# Patient Record
Sex: Male | Born: 1935 | Race: White | Hispanic: No | Smoking: Former smoker
Health system: Southern US, Community
[De-identification: ages and names within clinical notes are randomized; demographics above are authoritative.]

## PROBLEM LIST (undated history)

## (undated) DIAGNOSIS — K219 Gastro-esophageal reflux disease without esophagitis: Secondary | ICD-10-CM

## (undated) DIAGNOSIS — F329 Major depressive disorder, single episode, unspecified: Secondary | ICD-10-CM

## (undated) DIAGNOSIS — I639 Cerebral infarction, unspecified: Secondary | ICD-10-CM

## (undated) DIAGNOSIS — E7800 Pure hypercholesterolemia, unspecified: Secondary | ICD-10-CM

## (undated) DIAGNOSIS — F32A Depression, unspecified: Secondary | ICD-10-CM

## (undated) DIAGNOSIS — I219 Acute myocardial infarction, unspecified: Secondary | ICD-10-CM

## (undated) DIAGNOSIS — E119 Type 2 diabetes mellitus without complications: Secondary | ICD-10-CM

## (undated) DIAGNOSIS — I10 Essential (primary) hypertension: Secondary | ICD-10-CM

## (undated) HISTORY — PX: AORTIC VALVE REPLACEMENT (AVR)/CORONARY ARTERY BYPASS GRAFTING (CABG): SHX5725

---

## 2009-06-29 ENCOUNTER — Inpatient Hospital Stay: Payer: Self-pay | Admitting: Internal Medicine

## 2017-12-03 ENCOUNTER — Other Ambulatory Visit: Payer: Self-pay

## 2017-12-03 ENCOUNTER — Encounter: Payer: Self-pay | Admitting: Emergency Medicine

## 2017-12-03 ENCOUNTER — Inpatient Hospital Stay
Admission: EM | Admit: 2017-12-03 | Discharge: 2017-12-06 | DRG: 065 | Disposition: A | Discharge status: Skilled Nursing Facility | Payer: Medicare Other | Attending: Internal Medicine | Admitting: Internal Medicine

## 2017-12-03 ENCOUNTER — Emergency Department: Payer: Medicare Other

## 2017-12-03 DIAGNOSIS — R29898 Other symptoms and signs involving the musculoskeletal system: Secondary | ICD-10-CM

## 2017-12-03 DIAGNOSIS — Z8673 Personal history of transient ischemic attack (TIA), and cerebral infarction without residual deficits: Secondary | ICD-10-CM

## 2017-12-03 DIAGNOSIS — K219 Gastro-esophageal reflux disease without esophagitis: Secondary | ICD-10-CM | POA: Diagnosis present

## 2017-12-03 DIAGNOSIS — I2510 Atherosclerotic heart disease of native coronary artery without angina pectoris: Secondary | ICD-10-CM | POA: Diagnosis present

## 2017-12-03 DIAGNOSIS — Z952 Presence of prosthetic heart valve: Secondary | ICD-10-CM | POA: Diagnosis not present

## 2017-12-03 DIAGNOSIS — Z7902 Long term (current) use of antithrombotics/antiplatelets: Secondary | ICD-10-CM

## 2017-12-03 DIAGNOSIS — E785 Hyperlipidemia, unspecified: Secondary | ICD-10-CM | POA: Diagnosis present

## 2017-12-03 DIAGNOSIS — Z7982 Long term (current) use of aspirin: Secondary | ICD-10-CM

## 2017-12-03 DIAGNOSIS — I639 Cerebral infarction, unspecified: Secondary | ICD-10-CM | POA: Diagnosis not present

## 2017-12-03 DIAGNOSIS — F329 Major depressive disorder, single episode, unspecified: Secondary | ICD-10-CM | POA: Diagnosis present

## 2017-12-03 DIAGNOSIS — Z885 Allergy status to narcotic agent status: Secondary | ICD-10-CM | POA: Diagnosis not present

## 2017-12-03 DIAGNOSIS — G8194 Hemiplegia, unspecified affecting left nondominant side: Secondary | ICD-10-CM | POA: Diagnosis present

## 2017-12-03 DIAGNOSIS — I252 Old myocardial infarction: Secondary | ICD-10-CM | POA: Diagnosis not present

## 2017-12-03 DIAGNOSIS — E7800 Pure hypercholesterolemia, unspecified: Secondary | ICD-10-CM | POA: Diagnosis present

## 2017-12-03 DIAGNOSIS — Z794 Long term (current) use of insulin: Secondary | ICD-10-CM

## 2017-12-03 DIAGNOSIS — I10 Essential (primary) hypertension: Secondary | ICD-10-CM | POA: Diagnosis present

## 2017-12-03 DIAGNOSIS — F17220 Nicotine dependence, chewing tobacco, uncomplicated: Secondary | ICD-10-CM | POA: Diagnosis present

## 2017-12-03 DIAGNOSIS — E1151 Type 2 diabetes mellitus with diabetic peripheral angiopathy without gangrene: Secondary | ICD-10-CM | POA: Diagnosis present

## 2017-12-03 DIAGNOSIS — Z951 Presence of aortocoronary bypass graft: Secondary | ICD-10-CM | POA: Diagnosis not present

## 2017-12-03 DIAGNOSIS — R471 Dysarthria and anarthria: Secondary | ICD-10-CM | POA: Diagnosis present

## 2017-12-03 DIAGNOSIS — R29704 NIHSS score 4: Secondary | ICD-10-CM | POA: Diagnosis present

## 2017-12-03 HISTORY — DX: Pure hypercholesterolemia, unspecified: E78.00

## 2017-12-03 HISTORY — DX: Acute myocardial infarction, unspecified: I21.9

## 2017-12-03 HISTORY — DX: Type 2 diabetes mellitus without complications: E11.9

## 2017-12-03 HISTORY — DX: Essential (primary) hypertension: I10

## 2017-12-03 HISTORY — DX: Cerebral infarction, unspecified: I63.9

## 2017-12-03 HISTORY — DX: Depression, unspecified: F32.A

## 2017-12-03 HISTORY — DX: Major depressive disorder, single episode, unspecified: F32.9

## 2017-12-03 HISTORY — DX: Gastro-esophageal reflux disease without esophagitis: K21.9

## 2017-12-03 LAB — DIFFERENTIAL
BASOS PCT: 1 %
Basophils Absolute: 0 10*3/uL (ref 0–0.1)
Eosinophils Absolute: 0.3 10*3/uL (ref 0–0.7)
Eosinophils Relative: 5 %
LYMPHS ABS: 2.3 10*3/uL (ref 1.0–3.6)
Lymphocytes Relative: 29 %
MONO ABS: 0.8 10*3/uL (ref 0.2–1.0)
MONOS PCT: 10 %
NEUTROS ABS: 4.3 10*3/uL (ref 1.4–6.5)
Neutrophils Relative %: 55 %

## 2017-12-03 LAB — CBC
HEMATOCRIT: 41.1 % (ref 40.0–52.0)
HEMOGLOBIN: 13.6 g/dL (ref 13.0–18.0)
MCH: 30.2 pg (ref 26.0–34.0)
MCHC: 33.1 g/dL (ref 32.0–36.0)
MCV: 91.4 fL (ref 80.0–100.0)
Platelets: 215 10*3/uL (ref 150–440)
RBC: 4.49 MIL/uL (ref 4.40–5.90)
RDW: 14.2 % (ref 11.5–14.5)
WBC: 7.7 10*3/uL (ref 3.8–10.6)

## 2017-12-03 LAB — COMPREHENSIVE METABOLIC PANEL
ALK PHOS: 97 U/L (ref 38–126)
ALT: 10 U/L — AB (ref 17–63)
AST: 20 U/L (ref 15–41)
Albumin: 3.3 g/dL — ABNORMAL LOW (ref 3.5–5.0)
Anion gap: 4 — ABNORMAL LOW (ref 5–15)
BILIRUBIN TOTAL: 0.9 mg/dL (ref 0.3–1.2)
BUN: 18 mg/dL (ref 6–20)
CALCIUM: 9.3 mg/dL (ref 8.9–10.3)
CO2: 28 mmol/L (ref 22–32)
CREATININE: 0.79 mg/dL (ref 0.61–1.24)
Chloride: 103 mmol/L (ref 101–111)
Glucose, Bld: 351 mg/dL — ABNORMAL HIGH (ref 65–99)
Potassium: 4.2 mmol/L (ref 3.5–5.1)
Sodium: 135 mmol/L (ref 135–145)
TOTAL PROTEIN: 6.6 g/dL (ref 6.5–8.1)

## 2017-12-03 LAB — PROTIME-INR
INR: 0.98
Prothrombin Time: 12.9 seconds (ref 11.4–15.2)

## 2017-12-03 LAB — GLUCOSE, CAPILLARY
Glucose-Capillary: 342 mg/dL — ABNORMAL HIGH (ref 65–99)
Glucose-Capillary: 300 mg/dL — ABNORMAL HIGH (ref 65–99)

## 2017-12-03 LAB — TROPONIN I

## 2017-12-03 LAB — APTT: aPTT: 28 seconds (ref 24–36)

## 2017-12-03 MED ORDER — CLOPIDOGREL BISULFATE 75 MG PO TABS
75.0000 mg | ORAL_TABLET | Freq: Every evening | ORAL | Status: DC
  Administered 2017-12-03 – 2017-12-05 (×3): 75 mg via ORAL
  Filled 2017-12-03 (×3): qty 1

## 2017-12-03 MED ORDER — SENNOSIDES-DOCUSATE SODIUM 8.6-50 MG PO TABS: 1.0000 | ORAL_TABLET | Freq: Every evening | ORAL | Status: DC | PRN

## 2017-12-03 MED ORDER — INSULIN ASPART 100 UNIT/ML ~~LOC~~ SOLN
0.0000 [IU] | Freq: Every day | SUBCUTANEOUS | Status: DC
  Administered 2017-12-03: 22:00:00 3 [IU] via SUBCUTANEOUS
  Administered 2017-12-04: 23:00:00 2 [IU] via SUBCUTANEOUS
  Filled 2017-12-03 (×2): qty 1

## 2017-12-03 MED ORDER — ISOSORBIDE MONONITRATE ER 30 MG PO TB24
30.0000 mg | ORAL_TABLET | Freq: Every evening | ORAL | Status: DC
  Administered 2017-12-03 – 2017-12-05 (×3): 30 mg via ORAL
  Filled 2017-12-03 (×3): qty 1

## 2017-12-03 MED ORDER — INSULIN GLARGINE 100 UNIT/ML ~~LOC~~ SOLN
25.0000 [IU] | Freq: Every evening | SUBCUTANEOUS | Status: DC
  Administered 2017-12-03: 19:00:00 25 [IU] via SUBCUTANEOUS
  Filled 2017-12-03 (×2): qty 0.25

## 2017-12-03 MED ORDER — VITAMIN B-12 100 MCG PO TABS
100.0000 ug | ORAL_TABLET | Freq: Every day | ORAL | Status: DC
  Administered 2017-12-04 – 2017-12-06 (×3): 100 ug via ORAL
  Filled 2017-12-03 (×3): qty 1

## 2017-12-03 MED ORDER — ACETAMINOPHEN 160 MG/5ML PO SOLN: 650.0000 mg | ORAL | Status: DC | PRN

## 2017-12-03 MED ORDER — ENOXAPARIN SODIUM 40 MG/0.4ML ~~LOC~~ SOLN
40.0000 mg | SUBCUTANEOUS | Status: DC
  Administered 2017-12-03 – 2017-12-05 (×3): 40 mg via SUBCUTANEOUS
  Filled 2017-12-03 (×3): qty 0.4

## 2017-12-03 MED ORDER — ESCITALOPRAM OXALATE 10 MG PO TABS
10.0000 mg | ORAL_TABLET | Freq: Every day | ORAL | Status: DC
  Administered 2017-12-04 – 2017-12-06 (×3): 10 mg via ORAL
  Filled 2017-12-03 (×3): qty 1

## 2017-12-03 MED ORDER — PANTOPRAZOLE SODIUM 40 MG PO TBEC
40.0000 mg | DELAYED_RELEASE_TABLET | Freq: Two times a day (BID) | ORAL | Status: DC
  Administered 2017-12-03 – 2017-12-06 (×6): 40 mg via ORAL
  Filled 2017-12-03 (×6): qty 1

## 2017-12-03 MED ORDER — ACETAMINOPHEN 650 MG RE SUPP: 650.0000 mg | RECTAL | Status: DC | PRN

## 2017-12-03 MED ORDER — INSULIN ASPART 100 UNIT/ML ~~LOC~~ SOLN
0.0000 [IU] | Freq: Three times a day (TID) | SUBCUTANEOUS | Status: DC
  Administered 2017-12-03: 7 [IU] via SUBCUTANEOUS
  Administered 2017-12-04: 17:00:00 3 [IU] via SUBCUTANEOUS
  Administered 2017-12-04: 5 [IU] via SUBCUTANEOUS
  Administered 2017-12-05 (×2): 2 [IU] via SUBCUTANEOUS
  Administered 2017-12-05: 18:00:00 3 [IU] via SUBCUTANEOUS
  Administered 2017-12-06 (×2): 2 [IU] via SUBCUTANEOUS
  Filled 2017-12-03 (×8): qty 1

## 2017-12-03 MED ORDER — INSULIN GLARGINE 100 UNIT/ML ~~LOC~~ SOLN: 40.0000 [IU] | Freq: Every evening | SUBCUTANEOUS | Status: DC

## 2017-12-03 MED ORDER — ASPIRIN EC 81 MG PO TBEC
81.0000 mg | DELAYED_RELEASE_TABLET | Freq: Every day | ORAL | Status: DC
  Administered 2017-12-04 – 2017-12-06 (×3): 81 mg via ORAL
  Filled 2017-12-03 (×3): qty 1

## 2017-12-03 MED ORDER — STROKE: EARLY STAGES OF RECOVERY BOOK
Freq: Once | Status: AC
  Administered 2017-12-03: 17:00:00

## 2017-12-03 MED ORDER — ACETAMINOPHEN 325 MG PO TABS: 650.0000 mg | ORAL_TABLET | ORAL | Status: DC | PRN

## 2017-12-03 NOTE — ED Triage Notes (Signed)
Son reports pt has fallen 4 times over night.  Has noticed weakness in left leg over night.  Son reports was normal last night when he went bed.  Went to bed at 0930 pm 4/16.  Speech appears slurred in triage.  Son reports he is dragging leg now.  Son reports can lift arm but leg seems to keep getting weaker.

## 2017-12-03 NOTE — Code Documentation (Signed)
Pt arrives with complaints of left sided weakness, per son pt went to bed 4/16 2130 WNL, states he fell 4 times throughout the night and had increased left sided weakness this AM, code stroke activated in triage, Dr. Manson PasseyBrown cleared pt for CT, non-con head CT preformed and pt brought to room 10, NIHSS 4 with left sided weakness, dysarthria, and mild facial droop, no tPA due to LKW > 4.5 hrs, son at bedside, hearing aids given to son, CBG 313, report off to Dynegyngela RN

## 2017-12-03 NOTE — Progress Notes (Signed)
CODE STROKE- PHARMACY COMMUNICATION   Time CODE STROKE called/page received:1019  Time response to CODE STROKE was made (in person or via phone): 1100  Time Stroke Kit retrieved from [place] (only if needed): N/A   Past Medical History:  Diagnosis Date  . Acid reflux   . Depression   . Diabetes mellitus without complication ([place])   . Hypercholesteremia   . Hypertension   . MI (myocardial infarction) ([place])   . Stroke [facility])    Prior to Admission medications   Medication Sig Start Date End Date Taking? Authorizing Provider  aspirin EC 81 MG tablet Take 81 mg by mouth daily.   Yes [provider]  clopidogrel (PLAVIX) 75 MG tablet Take 75 mg by mouth every evening. 12/02/17  Yes [provider]  cyanocobalamin (TH VITAMIN B12) 100 MCG tablet Take 100 mcg by mouth daily.   Yes [provider]  escitalopram (LEXAPRO) 20 MG tablet Take 10 mg by mouth daily.   Yes [provider]  hydrochlorothiazide (MICROZIDE) 12.5 MG capsule Take 12.5 mg by mouth daily. 12/19/12  Yes [provider]  insulin glargine (LANTUS) 100 UNIT/ML injection Inject 54 Units into the skin every evening.   Yes [provider]  isosorbide mononitrate (IMDUR) 30 MG 24 hr tablet Take 30 mg by mouth every evening.   Yes [provider]  lisinopril (PRINIVIL,ZESTRIL) 2.5 MG tablet Take 2.5 mg by mouth daily.   Yes [provider]  pantoprazole (PROTONIX) 40 MG tablet Take 40 mg by mouth 2 (two) times daily.    Yes [provider]  propranolol (INDERAL) 10 MG tablet Take 10 mg by mouth 3 (three) times daily.   Yes [provider]  simvastatin (ZOCOR) 20 MG tablet Take 20 mg by mouth every evening.   Yes [provider]    Charlett Nose ,PharmD Clinical Pharmacist  12/03/2017  11:01 AM

## 2017-12-03 NOTE — ED Triage Notes (Signed)
Pt assess by dr brown

## 2017-12-03 NOTE — H&P (Signed)
[facility] [place] at [facility]   PATIENT NAME: Shannon Ayala    MR#:  [id number]  DATE OF BIRTH:  1924/03/16  DATE OF ADMISSION:  12/03/2017  PRIMARY CARE PHYSICIAN: System, Pcp Not In   REQUESTING/REFERRING PHYSICIAN: williams  CHIEF COMPLAINT:   Left leg weakness HISTORY OF PRESENT ILLNESS:  Shannon Ayala  is a 84 y.o. male with a known history of recurrent strokes, deafness metas, hypertension hyperlipidemia, history of MI, GERD is presenting to the ED with a chief complaint of worsening of left leg weakness.  CT head has revealed acute to subacute stroke.  Patient was evaluated by neurology Dr. Thad Ranger in the emergency department who has recommended to admit the patient for complete stroke workup.  Patient has passed bedside swallow evaluation.  Patient denies any headache or blurry vision.  Denies any chest pain or shortness of breath.  No family members at bedside.  PAST MEDICAL HISTORY:   Past Medical History:  Diagnosis Date  . Acid reflux   . Depression   . Diabetes mellitus without complication (HCC)   . Hypercholesteremia   . Hypertension   . MI (myocardial infarction) (HCC)   . Stroke [facility])     PAST SURGICAL HISTOIRY:   Past Surgical History:  Procedure Laterality Date  . AORTIC VALVE REPLACEMENT (AVR)/CORONARY ARTERY BYPASS GRAFTING (CABG)      SOCIAL HISTORY:   Social History   Tobacco Use  . Smoking status: Former Games developer  . Smokeless tobacco: Current User  Substance Use Topics  . Alcohol use: Yes    FAMILY HISTORY:  History reviewed. No pertinent family history.  DRUG ALLERGIES:   Allergies  Allergen Reactions  . Codeine     Reaction unknown  . Darvon [Propoxyphene]     Darvocet (Proproxyphene with Acetaminophen)  . Morphine     Reaction unknown    REVIEW OF SYSTEMS:  CONSTITUTIONAL: No fever, fatigue or weakness.  EYES: No blurred or double vision.  EARS, NOSE, AND THROAT: No tinnitus or ear  pain.  RESPIRATORY: No cough, shortness of breath, wheezing or hemoptysis.  CARDIOVASCULAR: No chest pain, orthopnea, edema.  GASTROINTESTINAL: No nausea, vomiting, diarrhea or abdominal pain.  GENITOURINARY: No dysuria, hematuria.  ENDOCRINE: No polyuria, nocturia,  HEMATOLOGY: No anemia, easy bruising or bleeding SKIN: No rash or lesion. MUSCULOSKELETAL: No joint pain or arthritis.   NEUROLOGIC: Reporting left-sided weakness she gets no tingling, numbness, weakness.  PSYCHIATRY: No anxiety or depression.   MEDICATIONS AT HOME:   Prior to Admission medications   Medication Sig Start Date End Date Taking? Authorizing Provider  aspirin EC 81 MG tablet Take 81 mg by mouth daily.   Yes [provider]  clopidogrel (PLAVIX) 75 MG tablet Take 75 mg by mouth every evening. 12/02/17  Yes [provider]  cyanocobalamin (TH VITAMIN B12) 100 MCG tablet Take 100 mcg by mouth daily.   Yes [provider]  escitalopram (LEXAPRO) 20 MG tablet Take 10 mg by mouth daily.   Yes [provider]  hydrochlorothiazide (MICROZIDE) 12.5 MG capsule Take 12.5 mg by mouth daily. 12/19/12  Yes [provider]  insulin glargine (LANTUS) 100 UNIT/ML injection Inject 54 Units into the skin every evening.   Yes [provider]  isosorbide mononitrate (IMDUR) 30 MG 24 hr tablet Take 30 mg by mouth every evening.   Yes [provider]  lisinopril (PRINIVIL,ZESTRIL) 2.5 MG tablet Take 2.5 mg by mouth daily.   Yes [provider]  pantoprazole (PROTONIX) 40 MG tablet Take 40 mg by mouth 2 (two) times daily.    Yes [provider]  propranolol (INDERAL) 10 MG tablet Take 10 mg by mouth 3 (three) times daily.   Yes [provider]  simvastatin (ZOCOR) 20 MG tablet Take 20 mg by mouth every evening.   Yes [provider]      VITAL SIGNS:  Blood pressure (!) 171/80, pulse 64, temperature 98.3 F (36.8 C), temperature source  Oral, resp. rate 19, height 5\' 6"  (1.676 m), weight 83.9 kg (185 lb), SpO2 96 %.  PHYSICAL EXAMINATION:  GENERAL:  84 y.o.-year-old patient lying in the bed with no acute distress.  EYES: Pupils equal, round, reactive to light and accommodation. No scleral icterus. Extraocular muscles intact.  HEENT: Head atraumatic, normocephalic. Oropharynx and nasopharynx clear.  NECK:  Supple, no jugular venous distention. No thyroid enlargement, no tenderness.  LUNGS: Normal breath sounds bilaterally, no wheezing, rales,rhonchi or crepitation. No use of accessory muscles of respiration.  CARDIOVASCULAR: S1, S2 normal. No murmurs, rubs, or gallops.  ABDOMEN: Soft, nontender, nondistended. Bowel sounds present. No organomegaly or mass.  EXTREMITIES: No pedal edema, cyanosis, or clubbing.  NEUROLOGIC: Cranial nerves II through XII are intact. Muscle strength 3-4 5 in all laps and lower extremities  and diffuse weakness. Sensation intact. Gait not checked.  PSYCHIATRIC: The patient is alert and oriented x 3.  SKIN: No obvious rash, lesion, or ulcer.   LABORATORY PANEL:   CBC Recent Labs  Lab 12/03/17 1036  WBC 7.7  HGB 13.6  HCT 41.1  PLT 215   ------------------------------------------------------------------------------------------------------------------  Chemistries  Recent Labs  Lab 12/03/17 1036  NA 135  K 4.2  CL 103  CO2 28  GLUCOSE 351*  BUN 18  CREATININE 0.79  CALCIUM 9.3  AST 20  ALT 10*  ALKPHOS 97  BILITOT 0.9   ------------------------------------------------------------------------------------------------------------------  Cardiac Enzymes Recent Labs  Lab 12/03/17 1036  TROPONINI <0.03   ------------------------------------------------------------------------------------------------------------------  RADIOLOGY:  Ct Head Code Stroke Wo Contrast  Addendum Date: 12/03/2017   ADDENDUM REPORT: 12/03/2017 10:43 ADDENDUM: Study discussed by telephone with Dr.  Daryel November on 12/03/2017 at 1040 hours. Electronically Signed   By: Odessa Fleming M.D.   On: 12/03/2017 10:43   Result Date: 12/03/2017 CLINICAL DATA:  Code stroke. 84 year old male with multiple recent falls. Slurred speech and dragging leg this morning, but went to bed normal at 2130 hours. EXAM: CT HEAD WITHOUT CONTRAST TECHNIQUE: Contiguous axial images were obtained from the base of the skull through the vertex without intravenous contrast. COMPARISON:  None. FINDINGS: Brain: Confluent gray and white matter hypodensity in the right occipital pole compatible with acute to subacute right PCA territory infarct (series 3, image 18 and sagittal image 21. No associated hemorrhage or mass effect. Superimposed small superior right peri rolandic region cortical (series 3, image 26) and subcortical white matter hypodensity. There is also confluent subcortical white matter hypodensity in the contralateral left occipital pole. No definite associated cortical encephalomalacia. Additional underlying bilateral confluent cerebral white matter hypodensity with some deep white matter capsule involvement. There is a small linear chronic appearing infarct in the medial left cerebellum (series 3, image 8). No other cortically based acute infarct identified. No midline shift, ventriculomegaly, mass effect, evidence of mass lesion, or intracranial hemorrhage identified. Vascular: Extensive Calcified atherosclerosis at the skull base. No suspicious intracranial vascular hyperdensity. Skull: No acute osseous abnormality identified. Sinuses/Orbits: Clear. Other: No acute orbit or scalp soft tissue findings.  ASPECTS [facility]([place] Stroke Program Early CT Score) - Ganglionic level infarction (caudate, lentiform nuclei, internal capsule, insula, M1-M3 cortex): 7 - Supraganglionic infarction (M4-M6 cortex): 2 (abnormal superior M5 segment) Total score (0-10 with 10 being normal): 9 IMPRESSION: 1. Acute to subacute appearing cortical infarcts in  the right occipital pole, but also the superior right perirolandic region. Query left side weakness. ASPECTS is 9 (abnormal superior right M5 segment). 2. No associated hemorrhage or mass effect. 3. Underlying chronic appearing ischemia in the left occipital lobe and cerebellum, and widespread additional bilateral white matter hypodensity compatible with chronic small vessel disease. Electronically Signed: By: Odessa FlemingH  Hall M.D. On: 12/03/2017 10:34    EKG:   Orders placed or performed during the hospital encounter of 12/03/17  . ED EKG  . ED EKG    IMPRESSION AND PLAN:   Shannon Ayala  is a 84 y.o. male with a known history of recurrent strokes, deafness metas, hypertension hyperlipidemia, history of MI, GERD is presenting to the ED with a chief complaint of worsening of left leg weakness.  CT head has revealed acute to subacute stroke.  Patient was evaluated by neurology Dr. Thad Rangereynolds in the emergency department who has recommended to admit the patient for complete stroke workup.  Patient has passed bedside swallow evaluation.  #Acute to subacute stroke with history of recurrent strokes in the past Admit to MedSurg unit  CT head has revealed acute to subacute stroke  will get complete stroke workup with MRI of the brain carotid Dopplers and 2D echocardiogram Patient has passed bedside swallow evaluation start him on diet PT, OT evaluation Check hemoglobin A1c, fasting lipid panel and TSH Neurology consulted Patient was on aspirin and Plavix at home will continue the same We will start patient on high intensity statin  #Essential hypertension We will allow permissive hypertension will hold off his home medication hydrochlorothiazide lisinopril and Inderal   #Insulin requiring diabetes mellitus Check hemoglobin A1c Lantus nightly, decrease the dose to 40 units from 54 units which is his home dose Consult diabetic coordinator  #Hyperlipidemia check fasting lipid panel and continue statin  at high intensity dose   #History of coronary artery disease status post MI Continue aspirin Plavix statin Holding Inderal and lisinopril in view of acute stroke to allow permissive hypertension  #Tobacco chewing disorder Counseled patient to stop chewing tobacco for 4-5 minutes.  Patient verbalized understanding but not considering nicotine patch   GI prophylaxis with Protonix and DVT prophylaxis with Lovenox subcu   All the records are reviewed and case discussed with ED provider. Management plans discussed with the patient,  and t voicemail left to sons phone to call us back regarding update  CODE STATUS: fc   TOTAL TIME TAKING CARE OF THIS PATIENT: 43 minutes.   Note: This dictation was prepared with Dragon dictation along with smaller phrase technology. Any transcriptional errors that result from this process are unintentional.  Ramonita LabAruna Jakory Matsuo M.D on 12/03/2017 at 1:27 PM  Between 7am to 6pm - Pager - [phone]  After 6pm go to [email or web address] - password [email or web address]  Fabio Neighborsagle [name]  Office  [phone]  CC: Primary care physician; System, Pcp Not In

## 2017-12-03 NOTE — Consult Note (Signed)
Referring Physician: Murtis SinkWiliams    Chief Complaint: Left sided weakness  HPI: Shannon Ayala is an 84 y.o. male who went to bed at baseline.  Awakened in the middle of the night and had multiple falls.  When his som saw him this morning also noted to have left sided weakness and slurred speech.  With no improvement in symptoms patient brought in for evaluation.   Patient had a fall in the kitchen two days ago and felt he had a stroke but per son was at baseline after the fall.   Initial NIHSS of 4.  Date last known well: Date: 12/02/2017 Time last known well: Time: 21:30 tPA Given: No: Outside time window  Past Medical History:  Diagnosis Date  . Acid reflux   . Depression   . Diabetes mellitus without complication (HCC)   . Hypercholesteremia   . Hypertension   . MI (myocardial infarction) ([facility])   . Stroke [facility])     Past Surgical History:  Procedure Laterality Date  . AORTIC VALVE REPLACEMENT (AVR)/CORONARY ARTERY BYPASS GRAFTING (CABG)      Family history: Son alive and well  Social History:  reports that he has quit smoking. He uses smokeless tobacco. He reports that he drinks alcohol. He reports that he does not use drugs.  Allergies:  Allergies  Allergen Reactions  . Codeine     Reaction unknown  . Darvon [Propoxyphene]     Darvocet (Proproxyphene with Acetaminophen)  . Morphine     Reaction unknown    Medications: I have reviewed the patient's current medications. Prior to Admission:  Prior to Admission medications   Medication Sig Start Date End Date Taking? Authorizing Provider  aspirin EC 81 MG tablet Take 81 mg by mouth daily.   Yes [provider]  clopidogrel (PLAVIX) 75 MG tablet Take 75 mg by mouth every evening. 12/02/17  Yes [provider]  cyanocobalamin (TH VITAMIN B12) 100 MCG tablet Take 100 mcg by mouth daily.   Yes [provider]  escitalopram (LEXAPRO) 20 MG tablet Take 10 mg by mouth daily.   Yes [provider]  hydrochlorothiazide (MICROZIDE) 12.5 MG capsule Take 12.5 mg by mouth daily. 12/19/12  Yes [provider]  insulin glargine (LANTUS) 100 UNIT/ML injection Inject 54 Units into the skin every evening.   Yes [provider]  isosorbide mononitrate (IMDUR) 30 MG 24 hr tablet Take 30 mg by mouth every evening.   Yes [provider]  lisinopril (PRINIVIL,ZESTRIL) 2.5 MG tablet Take 2.5 mg by mouth daily.   Yes [provider]  pantoprazole (PROTONIX) 40 MG tablet Take 40 mg by mouth 2 (two) times daily.    Yes [provider]  propranolol (INDERAL) 10 MG tablet Take 10 mg by mouth 3 (three) times daily.   Yes [provider]  simvastatin (ZOCOR) 20 MG tablet Take 20 mg by mouth every evening.   Yes [provider]    ROS: History obtained from son  General ROS: negative for - chills, fatigue, fever, night sweats, weight gain or weight loss Psychological ROS: negative for - behavioral disorder, hallucinations, memory difficulties, mood swings or suicidal ideation Ophthalmic ROS: negative for - blurry vision, double vision, eye pain or loss of vision ENT ROS: negative for - epistaxis, nasal discharge, oral lesions, sore throat, tinnitus or vertigo Allergy and Immunology ROS: negative for - hives or itchy/watery eyes Hematological and Lymphatic ROS: negative for - bleeding problems, bruising or swollen lymph nodes  Endocrine ROS: negative for - galactorrhea, hair pattern changes, polydipsia/polyuria or temperature intolerance Respiratory ROS: negative for - cough, hemoptysis, shortness of breath or wheezing Cardiovascular ROS: lower extremity swelling Gastrointestinal ROS: negative for - abdominal pain, diarrhea, hematemesis, nausea/vomiting or stool incontinence Genito-Urinary ROS: negative for - dysuria, hematuria, incontinence or urinary frequency/urgency Musculoskeletal ROS: negative for - joint swelling or muscular  weakness Neurological ROS: as noted in HPI Dermatological ROS: negative for rash and skin lesion changes  Physical Examination: Blood pressure (!) 181/61, pulse 73, temperature 98.3 F (36.8 C), temperature source Oral, resp. rate 20, height 5\' 6"  (1.676 m), weight 83.9 kg (185 lb), SpO2 97 %.  HEENT-  Normocephalic, no lesions, without obvious abnormality.  Normal external eye and conjunctiva.  Normal TM's bilaterally.  Normal auditory canals and external ears. Normal external nose, mucus membranes and septum.  Normal pharynx. Cardiovascular- S1, S2 normal, pulses palpable throughout   Lungs- chest clear, no wheezing, rales, normal symmetric air entry Abdomen- soft, non-tender; bowel sounds normal; no masses,  no organomegaly Extremities- BLE edema (left greater than right). Left arm swelling Lymph-no adenopathy palpable Musculoskeletal-no joint tenderness, deformity or swelling Skin-warm and dry, no hyperpigmentation, vitiligo, or suspicious lesions  Neurological Examination   Mental Status: Alert, oriented, thought content appropriate.  Speech fluent without evidence of aphasia.  Dysarthric.  Able to follow 3 step commands with some reinforcement. Cranial Nerves: II: Discs flat bilaterally; Visual fields grossly normal, pupils equal, round, reactive to light and accommodation III,IV, VI: ptosis not present, extra-ocular motions intact bilaterally V,VII: mild left facial drooop, facial light touch sensation normal bilaterally VIII: hearing normal bilaterally IX,X: gag reflex present XI: bilateral shoulder shrug XII: midline tongue extension Motor: Right : Upper extremity   5/5    Left:     Upper extremity   5-/5  Lower extremity   5/5     Lower extremity   4-/5 Tone and bulk:normal tone throughout; no atrophy noted Sensory: Pinprick and light touch intact throughout, bilaterally Deep Tendon Reflexes: 2+ and symmetric with absent AJ's bilaterally Plantars: Right: upgoing   Left:  upgoing Cerebellar: Normal finger-to-nose and normal heel-to-shin testing bilaterally Gait: not tested due to safety concerns    Laboratory Studies:  Basic Metabolic Panel: No results for input(s): NA, K, CL, CO2, GLUCOSE, BUN, CREATININE, CALCIUM, MG, PHOS in the last 168 hours.  Liver Function Tests: No results for input(s): AST, ALT, ALKPHOS, BILITOT, PROT, ALBUMIN in the last 168 hours. No results for input(s): LIPASE, AMYLASE in the last 168 hours. No results for input(s): AMMONIA in the last 168 hours.  CBC: Recent Labs  Lab 12/03/17 1036  WBC 7.7  NEUTROABS 4.3  HGB 13.6  HCT 41.1  MCV 91.4  PLT 215    Cardiac Enzymes: No results for input(s): CKTOTAL, CKMB, CKMBINDEX, TROPONINI in the last 168 hours.  BNP: Invalid input(s): POCBNP  CBG: No results for input(s): GLUCAP in the last 168 hours.  Microbiology: No results found for this or any previous visit.  Coagulation Studies: No results for input(s): LABPROT, INR in the last 72 hours.  Urinalysis: No results for input(s): COLORURINE, LABSPEC, PHURINE, GLUCOSEU, HGBUR, BILIRUBINUR, KETONESUR, PROTEINUR, UROBILINOGEN, NITRITE, LEUKOCYTESUR in the last 168 hours.  Invalid input(s): APPERANCEUR  Lipid Panel: No results found for: CHOL, TRIG, HDL, CHOLHDL, VLDL, LDLCALC  HgbA1C: No results found for: HGBA1C  Urine Drug Screen:  No results found for: LABOPIA, COCAINSCRNUR, LABBENZ, AMPHETMU, THCU, LABBARB  Alcohol Level: No results for input(s): [facility]  in the last 168 hours.   Imaging: Ct Head Code Stroke Wo Contrast  Addendum Date: 12/03/2017   ADDENDUM REPORT: 12/03/2017 10:43 ADDENDUM: Study discussed by telephone with Dr. Daryel November on 12/03/2017 at 1040 hours. Electronically Signed   By: Odessa Fleming M.D.   On: 12/03/2017 10:43   Result Date: 12/03/2017 CLINICAL DATA:  Code stroke. 84 year old male with multiple recent falls. Slurred speech and dragging leg this morning, but went to bed normal at 2130  hours. EXAM: CT HEAD WITHOUT CONTRAST TECHNIQUE: Contiguous axial images were obtained from the base of the skull through the vertex without intravenous contrast. COMPARISON:  None. FINDINGS: Brain: Confluent gray and white matter hypodensity in the right occipital pole compatible with acute to subacute right PCA territory infarct (series 3, image 18 and sagittal image 21. No associated hemorrhage or mass effect. Superimposed small superior right peri rolandic region cortical (series 3, image 26) and subcortical white matter hypodensity. There is also confluent subcortical white matter hypodensity in the contralateral left occipital pole. No definite associated cortical encephalomalacia. Additional underlying bilateral confluent cerebral white matter hypodensity with some deep white matter capsule involvement. There is a small linear chronic appearing infarct in the medial left cerebellum (series 3, image 8). No other cortically based acute infarct identified. No midline shift, ventriculomegaly, mass effect, evidence of mass lesion, or intracranial hemorrhage identified. Vascular: Extensive Calcified atherosclerosis at the skull base. No suspicious intracranial vascular hyperdensity. Skull: No acute osseous abnormality identified. Sinuses/Orbits: Clear. Other: No acute orbit or scalp soft tissue findings. ASPECTS [facility] Stroke Program Early CT Score) - Ganglionic level infarction (caudate, lentiform nuclei, internal capsule, insula, M1-M3 cortex): 7 - Supraganglionic infarction (M4-M6 cortex): 2 (abnormal superior M5 segment) Total score (0-10 with 10 being normal): 9 IMPRESSION: 1. Acute to subacute appearing cortical infarcts in the right occipital pole, but also the superior right perirolandic region. Query left side weakness. ASPECTS is 9 (abnormal superior right M5 segment). 2. No associated hemorrhage or mass effect. 3. Underlying chronic appearing ischemia in the left occipital lobe and cerebellum, and  widespread additional bilateral white matter hypodensity compatible with chronic small vessel disease. Electronically Signed: By: Odessa Fleming M.D. On: 12/03/2017 10:34    Assessment: 84 y.o. male presenting with left sided weakness and slurred speech.  Patient on ASA and Plavix at home.  Head CT reviewed and shows no acute changes.  Suspect acute infarct.  Further work up recommended.    Stroke Risk Factors - diabetes mellitus, hyperlipidemia and hypertension  Plan: 1. HgbA1c, fasting lipid panel 2. MRI, MRA  of the brain without contrast 3. PT consult, OT consult, Speech consult 4. Echocardiogram 5. Carotid dopplers 6. Prophylactic therapy-Continue ASA and Plavix once passes stroke swallow screen 7. NPO until RN stroke swallow screen 8. Telemetry monitoring 9. Frequent neuro checks   Thana Farr, MD Neurology [phone] 12/03/2017, 10:56 AM

## 2017-12-03 NOTE — ED Notes (Signed)
CBG 313. Valerie,RN aware.

## 2017-12-03 NOTE — Progress Notes (Signed)
Family Meeting Note  Advance Directive:yes  Today a meeting took place with the Patient.    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  Hypertension, hyperlipidemia, diabetes mellitus, GERD, coronary artery disease, treatment plan of care discussed in detail with the patient.  He verbalized understanding of the plan.  Patient's progosis: Unable to determine and Goals for treatment: Full Code, son Shannon Ayala is a healthcare POA  Additional follow-up to be provided: Hospitalist and neurology  Time spent during discussion:17 min  [name], MD

## 2017-12-03 NOTE — Plan of Care (Signed)
Pt progressing adequately. NIH scale of 5

## 2017-12-03 NOTE — Progress Notes (Signed)
   12/03/17 1020  Clinical Encounter Type  Visited With Family  Visit Type Code   Chaplain responded to code stroke.  Upon arrival, patient not in room.  Chaplain met with patient's son.  Son declined visit, but open to chaplain circling around to check back in, reported that he thought patient would enjoy chaplain visit.  Chaplain to follow.

## 2017-12-03 NOTE — Progress Notes (Signed)
Chaplain follow up with code stroke Pt with prayer for staff care and healing. Chaplain let family know of their services.    12/03/17 1049  Clinical Encounter Type  Visited With Patient and family together  Visit Type Code  Referral From Nurse  Spiritual Encounters  Spiritual Needs Prayer  Advance Directives (For Healthcare)  Does Patient Have a Medical Advance Directive? No  Would patient like information on creating a medical advance directive? No - Patient declined  Mental Health Advance Directives  Does Patient Have a Mental Health Advance Directive? No  Would patient like information on creating a mental health advance directive? No - Patient declined

## 2017-12-03 NOTE — ED Notes (Signed)
Dr. Reynolds at bedside.

## 2017-12-03 NOTE — Progress Notes (Signed)
Inpatient Diabetes Program Recommendations  AACE/ADA: New Consensus Statement on Inpatient Glycemic Control (2015)  Target Ranges:  Prepandial:   less than 140 mg/dL      Peak postprandial:   less than 180 mg/dL (1-2 hours)      Critically ill patients:  140 - 180 mg/dL   No results found for: GLUCAP, HGBA1C  Review of Glycemic ControlResults for Shannon FilbertREVATTE, [name] (MRN [id number]) as of 12/03/2017 14:04  Ref. Range 12/03/2017 10:36  Glucose Latest Ref Range: 65 - 99 mg/dL 578351 (H)   Diabetes history: Type 2 DM  Outpatient Diabetes medications: Lantus 54 units q PM Current orders for Inpatient glycemic control:  Novolog sensitive tid with meals and HS  Inpatient Diabetes Program Recommendations:    Referral received. Please consider adding Lantus 25 units daily (this is approximately 1/2 of home dose) while in the hospital.   Thanks Beryl MeagerJenny Masson Nalepa, RN, BC-ADM Inpatient Diabetes Coordinator Pager [phone] (8a-5p)

## 2017-12-03 NOTE — Progress Notes (Signed)
Pharmacist - Prescriber Communication  Enoxaparin dose modified from 30 mg subcutneously once daily to 40 mg subcutaneously once daily due to ABW greater than 50 kg and CrCl greater than 30 mL/min.  Helaine Yackel A. Nekomaookson, VermontPharm.D., BCPS Clinical Pharmacist 12/03/17 17:46

## 2017-12-03 NOTE — ED Provider Notes (Signed)
[facility] Provider [facility]       Time seen: ----------------------------------------- 10:31 AM on 12/03/2017 -----------------------------------------   I have reviewed the triage vital signs and the nursing notes.  HISTORY   Chief Complaint falls and Weakness    HPI Shannon Ayala is a 84 y.o. male with a history of GERD, depression, diabetes, hyperlipidemia CVA, MI and hypertension who presents to the ED for repeat falls.  Patient is fallen multiple times during the night, weakness was noted in the left leg this morning.  Son states this was not the case last night.  Speech has been reportedly somewhat slurred, he has a history of stroke but the son is not sure what the symptoms were when he had the stroke.  Past Medical History:  Diagnosis Date  . Acid reflux   . Depression   . Diabetes mellitus without complication (HCC)   . Hypercholesteremia   . Hypertension   . MI (myocardial infarction) (HCC)   . Stroke [facility](HCC)     There are no active problems to display for this patient.   Past Surgical History:  Procedure Laterality Date  . AORTIC VALVE REPLACEMENT (AVR)/CORONARY ARTERY BYPASS GRAFTING (CABG)      Allergies Patient has no known allergies.  Social History Social History   Tobacco Use  . Smoking status: Former Games developermoker  . Smokeless tobacco: Current User  Substance Use Topics  . Alcohol use: Yes  . Drug use: Never   Review of Systems Constitutional: Negative for fever. Cardiovascular: Negative for chest pain. Respiratory: Negative for shortness of breath. Gastrointestinal: Negative for abdominal pain, vomiting and diarrhea. Musculoskeletal: Negative for back pain. Skin: Negative for rash. Neurological: Positive for left leg weakness, falling, difficulty speaking  All systems negative/normal/unremarkable except as stated in the HPI  ____________________________________________   PHYSICAL EXAM:  VITAL  SIGNS: ED Triage Vitals  Enc Vitals Group     BP 12/03/17 1010 (!) 181/61     Pulse Rate 12/03/17 1010 73     Resp 12/03/17 1010 20     Temp 12/03/17 1010 98.3 F (36.8 C)     Temp Source 12/03/17 1010 Oral     SpO2 12/03/17 1010 97 %     Weight 12/03/17 1019 185 lb (83.9 kg)     Height 12/03/17 1019 5\' 6"  (1.676 m)     Head Circumference --      Peak Flow --      Pain Score 12/03/17 1018 0     Pain Loc --      Pain Edu? --      Excl. in GC? --    Constitutional: Alert and oriented. Well appearing and in no distress. Eyes: Conjunctivae are normal. Normal extraocular movements. ENT   Head: Normocephalic and atraumatic.   Nose: No congestion/rhinnorhea.   Mouth/Throat: Mucous membranes are moist.   Neck: No stridor. Cardiovascular: Normal rate, regular rhythm. No murmurs, rubs, or gallops. Respiratory: Normal respiratory effort without tachypnea nor retractions. Breath sounds are clear and equal bilaterally. No wheezes/rales/rhonchi. Gastrointestinal: Soft and nontender. Normal bowel sounds Musculoskeletal: Nontender with normal range of motion in extremities. No lower extremity tenderness nor edema. Neurologic: Left leg weak compared to right, occasional slurred speech is noted, otherwise cranial nerves and sensation appear normal Skin:  Skin is warm, dry and intact. No rash noted. Psychiatric: Mood and affect are normal. Speech and behavior are normal.  ____________________________________________  EKG: Interpreted by me.  Sinus rhythm rate 69 bpm, PAC,  normal axis, normal QT  ____________________________________________  ED COURSE:  As part of my medical decision making, I reviewed the following data within the electronic MEDICAL RECORD NUMBER History obtained from family if available, nursing notes, old chart and ekg, as well as notes from prior ED visits. Patient presented for falls in particular left leg weakness, we will assess with labs and imaging as indicated  at this time.   Procedures ____________________________________________   LABS (pertinent positives/negatives)  Labs Reviewed  COMPREHENSIVE METABOLIC PANEL - Abnormal; Notable for the following components:      Result Value   Glucose, Bld 351 (*)    Albumin 3.3 (*)    ALT 10 (*)    Anion gap 4 (*)    All other components within normal limits  PROTIME-INR  APTT  CBC  DIFFERENTIAL  TROPONIN I  CBG MONITORING, ED    RADIOLOGY Images were viewed by me Ct head IMPRESSION: 1. Acute to subacute appearing cortical infarcts in the right occipital pole, but also the superior right perirolandic region. Query left side weakness. ASPECTS is 9 (abnormal superior right M5 segment). 2. No associated hemorrhage or mass effect. 3. Underlying chronic appearing ischemia in the left occipital lobe and cerebellum, and widespread additional bilateral white matter hypodensity compatible with chronic small vessel disease.  ____________________________________________  DIFFERENTIAL DIAGNOSIS   CVA, TIA, hemorrahge   FINAL ASSESSMENT AND PLAN  CVA   Plan: The patient had presented for left leg weakness with some perhaps some slurred speech and has been falling. Patient's labs are grossly unremarkable. Patient's imaging did reveal acute infarcts on the right side.  Patient has been evaluated by neurology and has already taken his aspirin and Plavix.  We will proceed with inpatient evaluation.   Ulice Dash, MD   Note: This note was generated in part or whole with voice recognition software. Voice recognition is usually quite accurate but there are transcription errors that can and very often do occur. I apologize for any typographical errors that were not detected and corrected.     Emily Filbert, MD 12/03/17 1210

## 2017-12-03 NOTE — ED Notes (Signed)
CODE  STROKE  CALLED TO 333/  INFORMED  DR  Manson PasseyBROWN MD

## 2017-12-04 ENCOUNTER — Inpatient Hospital Stay
Admit: 2017-12-04 | Discharge: 2017-12-04 | Disposition: A | Discharge status: Home / Self Care | Payer: Medicare Other | Attending: Internal Medicine | Admitting: Internal Medicine

## 2017-12-04 ENCOUNTER — Inpatient Hospital Stay: Payer: Medicare Other

## 2017-12-04 LAB — LIPID PANEL
CHOL/HDL RATIO: 4.5 ratio
CHOLESTEROL: 121 mg/dL (ref 0–200)
HDL: 27 mg/dL — ABNORMAL LOW (ref >40)
LDL Cholesterol: 69 mg/dL (ref 0–99)
Triglycerides: 124 mg/dL (ref <150)
VLDL: 25 mg/dL (ref 0–40)

## 2017-12-04 LAB — GLUCOSE, CAPILLARY
GLUCOSE-CAPILLARY: 281 mg/dL — AB (ref 65–99)
GLUCOSE-CAPILLARY: 300 mg/dL — AB (ref 65–99)
GLUCOSE-CAPILLARY: 245 mg/dL — AB (ref 65–99)
Glucose-Capillary: 250 mg/dL — ABNORMAL HIGH (ref 65–99)

## 2017-12-04 LAB — TSH: TSH: 0.878 u[IU]/mL (ref 0.350–4.500)

## 2017-12-04 LAB — ECHOCARDIOGRAM COMPLETE
HEIGHTINCHES: 66 in
Weight: 2934.4 oz

## 2017-12-04 LAB — HEMOGLOBIN A1C
Hgb A1c MFr Bld: 10 % — ABNORMAL HIGH (ref 4.8–5.6)
MEAN PLASMA GLUCOSE: 240.3 mg/dL

## 2017-12-04 MED ORDER — INSULIN GLARGINE 100 UNIT/ML ~~LOC~~ SOLN
30.0000 [IU] | Freq: Every evening | SUBCUTANEOUS | Status: DC
  Administered 2017-12-04: 23:00:00 30 [IU] via SUBCUTANEOUS
  Filled 2017-12-04 (×2): qty 0.3

## 2017-12-04 NOTE — Evaluation (Signed)
Physical Therapy Evaluation Patient Details Name: Shannon Ayala MRN: [id number] DOB: May 20, 1924 Today's Date: 12/04/2017   History of Present Illness  Pt admitted for acute CVA. 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 Pt with complaints of L side weakness with multiple falls in past few weeks. Pt reports no vision changes. History includes depression, DM, HTN, MI, CVA, and GERD.   Clinical Impression  Pt is a pleasant 84 year old male who was admitted for acute CVA. Pt performs bed mobility with mod assist, transfers with min assist and RW, and ambulation with mod assist +2 and RW. Pt demonstrates deficits with balance/strength/mobility. Pt is at high risk for falls and would benefit from use of RW for increased stability. Pt is currently not at baseline level. Would benefit from skilled PT to address above deficits and promote optimal return to PLOF; recommend transition to STR upon discharge from acute hospitalization.       Follow Up Recommendations SNF    Equipment Recommendations  Rolling walker with 5" wheels    Recommendations for Other Services       Precautions / Restrictions Precautions Precautions: Fall Restrictions Weight Bearing Restrictions: No      Mobility  Bed Mobility Overal bed mobility: Needs Assistance Bed Mobility: Supine to Sit     Supine to sit: Mod assist     General bed mobility comments: needs assist to coordinate and reach with B UE as well as assist for B LE. Once seated at EOB, able to sit with cga, although reports dizziness symptoms.  Transfers Overall transfer level: Needs assistance Equipment used: Rolling walker (2 wheeled) Transfers: Sit to/from Stand Sit to Stand: Min assist         General transfer comment: several attempts standing with heavy post leaning and bracing on bed. Able to work on balance techniques to right posture.   Ambulation/Gait Ambulation/Gait assistance: Mod assist;+2 physical assistance Ambulation  Distance (Feet): 3 Feet Assistive device: Rolling walker (2 wheeled) Gait Pattern/deviations: Step-to pattern     General Gait Details: weakness and decreased strength noted in dynamic movement and with steps towards recliner on L side. +2 required for ambulation with pt losing balance towards L side. Needs max assist for repositioning and fatigues quickly  Stairs            Wheelchair Mobility    Modified Rankin (Stroke Patients Only)       Balance Overall balance assessment: Needs assistance;History of Falls Sitting-balance support: Feet supported Sitting balance-Leahy Scale: Good     Standing balance support: Bilateral upper extremity supported Standing balance-Leahy Scale: Poor                               Pertinent Vitals/Pain Pain Assessment: No/denies pain    Home Living Family/patient expects to be discharged to:: Private residence Living Arrangements: Children Available Help at Discharge: Family;Available PRN/intermittently Type of Home: House Home Access: Stairs to enter Entrance Stairs-Rails: None Entrance Stairs-Number of Steps: 1 step to enter onto porch and then 1 step to enter home Home Layout: Able to live on main level with bedroom/bathroom Home Equipment: Walker - 4 wheels;Cane - single point      Prior Function Level of Independence: Independent with assistive device(s)         Comments: was previously using SPC, however multiple falls recently     Hand Dominance        Extremity/Trunk Assessment  Upper Extremity Assessment Upper Extremity Assessment: Generalized weakness;LUE deficits/detail(L UE grossly 4/5; R UE grossly 5/5) LUE Deficits / Details: grip 4-/5; elbow flexor/extensor 4/5 LUE Sensation: WNL LUE Coordination: decreased gross motor;decreased fine motor    Lower Extremity Assessment Lower Extremity Assessment: LLE deficits/detail;Generalized weakness(R LE grossly 4+/5) LLE Deficits / Details: L LE  grossly 3+/5 in hip flexors and 4/5 in knee extensors and dorsi flexion LLE Sensation: WNL LLE Coordination: decreased fine motor;decreased gross motor       Communication   Communication: No difficulties  Cognition Arousal/Alertness: Awake/alert Behavior During Therapy: WFL for tasks assessed/performed Overall Cognitive Status: Impaired/Different from baseline                                 General Comments: poor historian      General Comments      Exercises Other Exercises Other Exercises: supine ther-ex performed x 10 reps on L LE including ankle pumps, heel slides, SLRs, LAQ, and alt. marching in seated and standing position. All ther-ex performed with cga and cues for correct technique. Also performed several reps of dynamic standing to improve balance with upright activities   Assessment/Plan    PT Assessment Patient needs continued PT services  PT Problem List Decreased strength;Decreased balance;Decreased mobility;Decreased cognition;Decreased knowledge of use of DME;Decreased safety awareness       PT Treatment Interventions DME instruction;Gait training;Therapeutic exercise;Balance training    PT Goals (Current goals can be found in the Care Plan section)  Acute Rehab PT Goals Patient Stated Goal: to get stronger PT Goal Formulation: With patient/family Time For Goal Achievement: 12/18/17 Potential to Achieve Goals: Good    Frequency 7X/week   Barriers to discharge        Co-evaluation               AM-PAC PT "6 Clicks" Daily Activity  Outcome Measure Difficulty turning over in bed (including adjusting bedclothes, sheets and blankets)?: Unable Difficulty moving from lying on back to sitting on the side of the bed? : Unable Difficulty sitting down on and standing up from a chair with arms (e.g., wheelchair, bedside commode, etc,.)?: Unable Help needed moving to and from a bed to chair (including a wheelchair)?: A Lot Help needed  walking in hospital room?: Total Help needed climbing 3-5 steps with a railing? : Total 6 Click Score: 7    End of Session Equipment Utilized During Treatment: Gait belt Activity Tolerance: Patient tolerated treatment well Patient left: in chair;with chair alarm set Nurse Communication: Mobility status PT Visit Diagnosis: Unsteadiness on feet (R26.81);Muscle weakness (generalized) (M62.81);Repeated falls (R29.6);History of falling (Z91.81);Difficulty in walking, not elsewhere classified (R26.2);Hemiplegia and hemiparesis Hemiplegia - Right/Left: Left Hemiplegia - dominant/non-dominant: Non-dominant Hemiplegia - caused by: Cerebral infarction    Time: 1610-9604 PT Time Calculation (min) (ACUTE ONLY): 35 min   Charges:   PT Evaluation $PT Eval Low Complexity: 1 Low PT Treatments $Therapeutic Exercise: 8-22 mins $Therapeutic Activity: 8-22 mins   PT G Codes:        Elizabeth Palau, PT, DPT [phone]   Christabel Camire 12/04/2017, 1:01 PM

## 2017-12-04 NOTE — Progress Notes (Signed)
SLP Cancellation Note  Patient Details Name: Shannon Ayala MRN: [id number] DOB: 06/25/1924   Cancelled treatment:       Reason Eval/Treat Not Completed: Patient at procedure or test/unavailable(chart reviewed; met briefly w/ pt). Pt was attempting to go to the bathroom then was to have the Echocardiogram. Pt required moderate verbal cues in simple statements for better comprehension; pt is also [facility] w/ aids.  Recommended lessening stimulation/distraction in room to allow pt to rest d/t the increased activity since admission. MRI revealed small vessel infarction in the posterior right basal ganglia and radiating white matter tracts. Pt is verbally conversive but unsure of pt's baseline Cognitive status; memory deficit noted per MD office note in chart(04/2017). Pt has a h/o falls since the Fall of 2018 per chart. Recommended cutting tougher meats and moistening d/t dx of GERD. Will f/u tomorrow w/ indicated assessments.    Orinda Kenner, MS, CCC-SLP Watson,Katherine 12/04/2017, 3:34 PM

## 2017-12-04 NOTE — Consult Note (Signed)
Subjective: Patient more alert today.  No new neurological complaints.    Objective: Current vital signs: BP (!) 146/61 (BP Location: Right Arm)   Pulse 67   Temp (!) 97.5 F (36.4 C) (Oral)   Resp 16   Ht 5\' 6"  (1.676 m)   Wt 83.2 kg (183 lb 6.4 oz)   SpO2 97%   BMI 29.60 kg/m  Vital signs in last 24 hours: Temp:  [97.5 F (36.4 C)-98.8 F (37.1 C)] 97.5 F (36.4 C) (04/18 1207) Pulse Rate:  [58-72] 67 (04/18 1207) Resp:  [16-22] 16 (04/18 1207) BP: (121-192)/(46-100) 146/61 (04/18 1207) SpO2:  [92 %-99 %] 97 % (04/18 1207) Weight:  [83.2 kg (183 lb 6.4 oz)] 83.2 kg (183 lb 6.4 oz) (04/17 1524)  Intake/Output from previous day: 04/17 0701 - 04/18 0700 In: -  Out: 525 [Urine:525] Intake/Output this shift: No intake/output data recorded. Nutritional status: Diet Carb Modified Fluid consistency: Thin; Room service appropriate? Yes Fall precautions Aspiration precautions  Neurologic Exam: Mental Status: Alert, oriented, thought content appropriate.  Speech fluent without evidence of aphasia.  Dysarthric.  Able to follow 3 step commands with some reinforcement. Cranial Nerves: II: Discs flat bilaterally; Visual fields grossly normal, pupils equal, round, reactive to light and accommodation III,IV, VI: ptosis not present, extra-ocular motions intact bilaterally V,VII: mild left facial drooop, facial light touch sensation normal bilaterally VIII: hearing normal bilaterally IX,X: gag reflex present XI: bilateral shoulder shrug XII: midline tongue extension Motor: Right :  Upper extremity   5/5                                      Left:     Upper extremity   5-/5             Lower extremity   5/5                                                  Lower extremity   4-/5 Sensory: Pinprick and light touch intact throughout, bilaterally   Lab Results: Basic Metabolic Panel: Recent Labs  Lab 12/03/17 1036  NA 135  K 4.2  CL 103  CO2 28  GLUCOSE 351*  BUN 18   CREATININE 0.79  CALCIUM 9.3    Liver Function Tests: Recent Labs  Lab 12/03/17 1036  AST 20  ALT 10*  ALKPHOS 97  BILITOT 0.9  PROT 6.6  ALBUMIN 3.3*   No results for input(s): LIPASE, AMYLASE in the last 168 hours. No results for input(s): AMMONIA in the last 168 hours.  CBC: Recent Labs  Lab 12/03/17 1036  WBC 7.7  NEUTROABS 4.3  HGB 13.6  HCT 41.1  MCV 91.4  PLT 215    Cardiac Enzymes: Recent Labs  Lab 12/03/17 1036  TROPONINI <0.03    Lipid Panel: Recent Labs  Lab 12/04/17 0427  CHOL 121  TRIG 124  HDL 27*  CHOLHDL 4.5  VLDL 25  LDLCALC 69    CBG: Recent Labs  Lab 12/03/17 1806 12/03/17 2111  GLUCAP 342* 300*    Microbiology: No results found for this or any previous visit.  Coagulation Studies: Recent Labs    12/03/17 1036  LABPROT 12.9  INR 0.98    Imaging: Dg Eye Foreign  Body  Result Date: 12/04/2017 CLINICAL DATA:  Metal working/exposure; clearance prior to MRI EXAM: ORBITS FOR FOREIGN BODY - 2 VIEW COMPARISON:  None. FINDINGS: There is no evidence of metallic foreign body within the orbits. No significant bone abnormality identified. Bilateral hearing aids noted. IMPRESSION: No evidence of metallic foreign body within the orbits. Bilateral hearing aids noted. Electronically Signed   By: Charlett NoseKevin  Dover M.D.   On: 12/04/2017 09:12   Dg Neck Soft Tissue  Result Date: 12/04/2017 CLINICAL DATA:  Evaluate for possible retained metal fragments EXAM: NECK SOFT TISSUES - 1+ VIEW COMPARISON:  None. FINDINGS: Degenerative changes of the cervical spine are noted. Postoperative clips are noted within the right neck. Bilateral hearing aids are again noted. Postsurgical changes in the sternum are noted. No other focal abnormality is noted. IMPRESSION: Postsurgical changes as described. Electronically Signed   By: Alcide CleverMark  Lukens M.D.   On: 12/04/2017 09:21   Dg Abd 1 View  Result Date: 12/04/2017 CLINICAL DATA:  Evaluate for retained metal for  MRI clearance EXAM: ABDOMEN - 1 VIEW COMPARISON:  None. FINDINGS: Scattered large and small bowel gas is noted. No abnormal mass or abnormal calcifications are seen. Degenerative changes of the lumbar spine are noted. No retained metallic densities are identified. IMPRESSION: No definitive retained metal foreign body Electronically Signed   By: Alcide CleverMark  Lukens M.D.   On: 12/04/2017 09:21   Mr Brain Wo Contrast  Result Date: 12/04/2017 CLINICAL DATA:  Recurrent strokes. Worsening left leg weakness beginning yesterday. EXAM: MRI HEAD WITHOUT CONTRAST MRA HEAD WITHOUT CONTRAST TECHNIQUE: Multiplanar, multiecho pulse sequences of the brain and surrounding structures were obtained without intravenous contrast. Angiographic images of the head were obtained using MRA technique without contrast. COMPARISON:  CT 12/03/2017 FINDINGS: MRI HEAD FINDINGS Brain: Acute small vessel infarction in the right posterior basal ganglia and radiating white matter tracts. No other acute finding. Chronic small-vessel ischemic changes affect the pons. Old small vessel cerebellar infarctions. Old bilateral occipital cortical and subcortical infarctions. Old cortical and subcortical infarctions in the right parietal lobe. Chronic small-vessel ischemic changes of the thalami, basal ganglia and hemispheric white matter. No mass lesion, hemorrhage, hydrocephalus or extra-axial collection. Vascular: Major vessels at the base of the brain show flow. Skull and upper cervical spine: Negative Sinuses/Orbits: Clear/normal Other: None MRA HEAD FINDINGS Both internal carotid arteries are widely patent through the skull base and siphon regions. The anterior and middle cerebral vessels are patent without proximal stenosis, aneurysm or vascular malformation. Distal vessels show atherosclerotic irregularity. Both vertebral arteries are patent with the left being dominant. Mild atherosclerotic irregularity of the V4 segments without stenosis. The basilar  artery is widely patent. Superior cerebellar and posterior cerebral arteries are patent. Distal vessel atherosclerotic irregularity. IMPRESSION: Acute small vessel infarction in the posterior right basal ganglia and radiating white matter tracts. Old cortical infarctions affecting both occipital lobes in the right parietal region. Widespread chronic small-vessel ischemic changes throughout the brain. MRA does not show a large or medium vessel occlusion or correctable proximal stenosis. Distal vessel atherosclerotic irregularity diffusely. Electronically Signed   By: Paulina FusiMark  Shogry M.D.   On: 12/04/2017 11:46   [place] Carotid Bilateral (at [facility] And [facility] Only)  Result Date: 12/04/2017 CLINICAL DATA:  84 year old male with a history of cerebrovascular accident. Cardiovascular risk factors include stroke/TIA, hypertension, coronary artery disease, hyperlipidemia, diabetes, smoking Given history of prior left endarterectomy EXAM: BILATERAL CAROTID DUPLEX ULTRASOUND TECHNIQUE: Wallace CullensGray scale imaging, color Doppler and duplex ultrasound were performed of  bilateral carotid and vertebral arteries in the neck. COMPARISON:  No prior duplex FINDINGS: Criteria: Quantification of carotid stenosis is based on velocity parameters that correlate the residual internal carotid diameter with NASCET-based stenosis levels, using the diameter of the distal internal carotid lumen as the denominator for stenosis measurement. The following velocity measurements were obtained: RIGHT ICA:  Systolic 113 cm/sec, Diastolic 20 cm/sec CCA:  92 cm/sec SYSTOLIC ICA/CCA RATIO:  1.2 ECA:  88 cm/sec LEFT ICA:  Systolic 102 cm/sec, Diastolic 24 cm/sec CCA:  97 cm/sec SYSTOLIC ICA/CCA RATIO:  1.0 ECA: Occluded Right Brachial SBP: Not acquired Left Brachial SBP: Not acquired RIGHT CAROTID ARTERY: Significant calcifications of the right common carotid artery. Intermediate waveform maintained. Moderate heterogeneous and partially calcified plaque at the right  carotid bifurcation. Lumen shadowing present. Low resistance waveform of the right ICA. Mild tortuosity RIGHT VERTEBRAL ARTERY: Antegrade flow with low resistance waveform. LEFT CAROTID ARTERY: No significant calcifications of the left common carotid artery. Intermediate waveform maintained. Moderate heterogeneous and partially calcified plaque at the left carotid bifurcation. No significant lumen shadowing. Low resistance waveform of the left ICA. No significant tortuosity. LEFT VERTEBRAL ARTERY:  Antegrade flow with low resistance waveform. IMPRESSION: Right: Heterogeneous and calcified plaque at the right carotid bifurcation, as well as within the common carotid artery, with no hemodynamically significant stenosis by established duplex criteria. Note that the flow velocities of the right ICA were obtained from an area distal to the maximum narrowing due to the presence of anterior wall plaque with shadowing and may be underestimating the percentage of ICA stenosis. If a more precise assessment is required, consider a formal cerebral angiogram or alternatively CT angiogram Left: There is a history of left carotid endarterectomy, and note that established duplex criteria have not been validated in the setting of postsurgical changes. However, the duplex study shows no evidence of significant restenosis or plaque. Signed, Yvone Neu. Loreta Ave, DO [facility] Radiology Electronically Signed   By: Gilmer Mor D.O.   On: 12/04/2017 10:55   Dg Chest Port 1 View  Result Date: 12/04/2017 CLINICAL DATA:  MRI clearance EXAM: PORTABLE CHEST 1 VIEW COMPARISON:  06/29/2009 FINDINGS: Enlargement of cardiac silhouette post median sternotomy. Multiple sternal wires present. Atherosclerotic calcification aorta. Mediastinal contours and pulmonary vascularity normal. Minimal bronchitic changes without pulmonary infiltrate, pleural effusion or pneumothorax. Deformity of multiple  lateral LEFT ribs secondary to old fractures. Few gown snaps project over the shoulders and LEFT supraclavicular region. EKG lead lower RIGHT chest. No other metallic foreign bodies. IMPRESSION: Enlargement of cardiac silhouette post median sternotomy. No acute abnormalities. Electronically Signed   By: Ulyses Southward M.D.   On: 12/04/2017 09:17   Mr Maxine Glenn Head/brain ZO Cm  Result Date: 12/04/2017 CLINICAL DATA:  Recurrent strokes. Worsening left leg weakness beginning yesterday. EXAM: MRI HEAD WITHOUT CONTRAST MRA HEAD WITHOUT CONTRAST TECHNIQUE: Multiplanar, multiecho pulse sequences of the brain and surrounding structures were obtained without intravenous contrast. Angiographic images of the head were obtained using MRA technique without contrast. COMPARISON:  CT 12/03/2017 FINDINGS: MRI HEAD FINDINGS Brain: Acute small vessel infarction in the right posterior basal ganglia and radiating white matter tracts. No other acute finding. Chronic small-vessel ischemic changes affect the pons. Old small vessel cerebellar infarctions. Old bilateral occipital cortical and subcortical infarctions. Old cortical and subcortical infarctions in the right parietal lobe. Chronic small-vessel ischemic changes of the thalami, basal ganglia and hemispheric white matter. No mass lesion, hemorrhage, hydrocephalus or extra-axial collection. Vascular:  Major vessels at the base of the brain show flow. Skull and upper cervical spine: Negative Sinuses/Orbits: Clear/normal Other: None MRA HEAD FINDINGS Both internal carotid arteries are widely patent through the skull base and siphon regions. The anterior and middle cerebral vessels are patent without proximal stenosis, aneurysm or vascular malformation. Distal vessels show atherosclerotic irregularity. Both vertebral arteries are patent with the left being dominant. Mild atherosclerotic irregularity of the V4 segments without stenosis. The basilar artery is widely patent. Superior  cerebellar and posterior cerebral arteries are patent. Distal vessel atherosclerotic irregularity. IMPRESSION: Acute small vessel infarction in the posterior right basal ganglia and radiating white matter tracts. Old cortical infarctions affecting both occipital lobes in the right parietal region. Widespread chronic small-vessel ischemic changes throughout the brain. MRA does not show a large or medium vessel occlusion or correctable proximal stenosis. Distal vessel atherosclerotic irregularity diffusely. Electronically Signed   By: Paulina Fusi M.D.   On: 12/04/2017 11:46   Ct Head Code Stroke Wo Contrast  Addendum Date: 12/03/2017   ADDENDUM REPORT: 12/03/2017 10:43 ADDENDUM: Study discussed by telephone with Dr. Daryel November on 12/03/2017 at 1040 hours. Electronically Signed   By: Odessa Fleming M.D.   On: 12/03/2017 10:43   Result Date: 12/03/2017 CLINICAL DATA:  Code stroke. 84 year old male with multiple recent falls. Slurred speech and dragging leg this morning, but went to bed normal at 2130 hours. EXAM: CT HEAD WITHOUT CONTRAST TECHNIQUE: Contiguous axial images were obtained from the base of the skull through the vertex without intravenous contrast. COMPARISON:  None. FINDINGS: Brain: Confluent gray and white matter hypodensity in the right occipital pole compatible with acute to subacute right PCA territory infarct (series 3, image 18 and sagittal image 21. No associated hemorrhage or mass effect. Superimposed small superior right peri rolandic region cortical (series 3, image 26) and subcortical white matter hypodensity. There is also confluent subcortical white matter hypodensity in the contralateral left occipital pole. No definite associated cortical encephalomalacia. Additional underlying bilateral confluent cerebral white matter hypodensity with some deep white matter capsule involvement. There is a small linear chronic appearing infarct in the medial left cerebellum (series 3, image 8). No other  cortically based acute infarct identified. No midline shift, ventriculomegaly, mass effect, evidence of mass lesion, or intracranial hemorrhage identified. Vascular: Extensive Calcified atherosclerosis at the skull base. No suspicious intracranial vascular hyperdensity. Skull: No acute osseous abnormality identified. Sinuses/Orbits: Clear. Other: No acute orbit or scalp soft tissue findings. ASPECTS [facility] Stroke Program Early CT Score) - Ganglionic level infarction (caudate, lentiform nuclei, internal capsule, insula, M1-M3 cortex): 7 - Supraganglionic infarction (M4-M6 cortex): 2 (abnormal superior M5 segment) Total score (0-10 with 10 being normal): 9 IMPRESSION: 1. Acute to subacute appearing cortical infarcts in the right occipital pole, but also the superior right perirolandic region. Query left side weakness. ASPECTS is 9 (abnormal superior right M5 segment). 2. No associated hemorrhage or mass effect. 3. Underlying chronic appearing ischemia in the left occipital lobe and cerebellum, and widespread additional bilateral white matter hypodensity compatible with chronic small vessel disease. Electronically Signed: By: Odessa Fleming M.D. On: 12/03/2017 10:34    Medications:  I have reviewed the patient's current medications. Scheduled: . aspirin EC  81 mg Oral Daily  . clopidogrel  75 mg Oral QPM  . enoxaparin (LOVENOX) injection  40 mg Subcutaneous Q24H  . escitalopram  10 mg Oral Daily  . insulin aspart  0-5 Units Subcutaneous QHS  . insulin aspart  0-9 Units Subcutaneous TID  WC  . insulin glargine  30 Units Subcutaneous QPM  . isosorbide mononitrate  30 mg Oral QPM  . pantoprazole  40 mg Oral BID AC  . cyanocobalamin  100 mcg Oral Daily    Assessment/Plan: Patient continues to have left sided weakness.  MRI of the brain reviewed and shows an acute right BG infarct.  MRA unremarkable.   Patient on ASA and Plavix.  Carotid doppler shows the left to likely be patent.  The right with some  heterogenous plaque noted of unclear stenosis.  Echocardiogram shows no cardiac source of emboli with an EF of 55-60%.  A1c 10, LDL 69.  Recommendations: 1. Patient to remain on ASA and Plavix 2. Blood sugar management with target A1c<7.0 3. Would discuss with family whether they would be interested in surgical intervention of a carotid stenosis.  If so, patient to have a CTA of the neck. 4. PT, OT, Speech therapy   LOS: 1 day   Thana Farr, MD Neurology [phone] 12/04/2017  12:27 PM

## 2017-12-04 NOTE — Progress Notes (Signed)
[facility] [place] [facility]   PATIENT NAME: Shannon Ayala    MR#:  [id number]  DATE OF BIRTH:  [birth year]  SUBJECTIVE:    REVIEW OF SYSTEMS:   ROS Tolerating Diet: Tolerating PT:   DRUG ALLERGIES:   Allergies  Allergen Reactions  . Codeine     Reaction unknown  . Darvon [Propoxyphene]     Darvocet (Proproxyphene with Acetaminophen)  . Morphine     Reaction unknown    VITALS:  Blood pressure (!) 146/61, pulse 67, temperature (!) 97.5 F (36.4 C), temperature source Oral, resp. rate 16, height 5\' 6"  (1.676 m), weight 83.2 kg (183 lb 6.4 oz), SpO2 97 %.  PHYSICAL EXAMINATION:   Physical Exam  GENERAL:  84 y.o.-year-old patient lying in the bed with no acute distress.  EYES: Pupils equal, round, reactive to light and accommodation. No scleral icterus. Extraocular muscles intact.  HEENT: Head atraumatic, normocephalic. Oropharynx and nasopharynx clear.  NECK:  Supple, no jugular venous distention. No thyroid enlargement, no tenderness.  LUNGS: Normal breath sounds bilaterally, no wheezing, rales, rhonchi. No use of accessory muscles of respiration.  CARDIOVASCULAR: S1, S2 normal. No murmurs, rubs, or gallops.  ABDOMEN: Soft, nontender, nondistended. Bowel sounds present. No organomegaly or mass.  EXTREMITIES: No cyanosis, clubbing or edema b/l.    NEUROLOGIC: Cranial nerves II through XII are intact. No focal Motor or sensory deficits b/l.   PSYCHIATRIC:  patient is alert and oriented x 3.  SKIN: No obvious rash, lesion, or ulcer.   LABORATORY PANEL:  CBC Recent Labs  Lab 12/03/17 1036  WBC 7.7  HGB 13.6  HCT 41.1  PLT 215    Chemistries  Recent Labs  Lab 12/03/17 1036  NA 135  K 4.2  CL 103  CO2 28  GLUCOSE 351*  BUN 18  CREATININE 0.79  CALCIUM 9.3  AST 20  ALT 10*  ALKPHOS 97  BILITOT 0.9   Cardiac Enzymes Recent Labs  Lab 12/03/17 1036  TROPONINI <0.03   RADIOLOGY:  Dg Eye Foreign Body  Result  Date: 12/04/2017 CLINICAL DATA:  Metal working/exposure; clearance prior to MRI EXAM: ORBITS FOR FOREIGN BODY - 2 VIEW COMPARISON:  None. FINDINGS: There is no evidence of metallic foreign body within the orbits. No significant bone abnormality identified. Bilateral hearing aids noted. IMPRESSION: No evidence of metallic foreign body within the orbits. Bilateral hearing aids noted. Electronically Signed   By: Charlett Nose M.D.   On: 12/04/2017 09:12   Dg Neck Soft Tissue  Result Date: 12/04/2017 CLINICAL DATA:  Evaluate for possible retained metal fragments EXAM: NECK SOFT TISSUES - 1+ VIEW COMPARISON:  None. FINDINGS: Degenerative changes of the cervical spine are noted. Postoperative clips are noted within the right neck. Bilateral hearing aids are again noted. Postsurgical changes in the sternum are noted. No other focal abnormality is noted. IMPRESSION: Postsurgical changes as described. Electronically Signed   By: Alcide Clever M.D.   On: 12/04/2017 09:21   Dg Abd 1 View  Result Date: 12/04/2017 CLINICAL DATA:  Evaluate for retained metal for MRI clearance EXAM: ABDOMEN - 1 VIEW COMPARISON:  None. FINDINGS: Scattered large and small bowel gas is noted. No abnormal mass or abnormal calcifications are seen. Degenerative changes of the lumbar spine are noted. No retained metallic densities are identified. IMPRESSION: No definitive retained metal foreign body Electronically Signed   By: Alcide Clever M.D.   On: 12/04/2017 09:21   Mr Brain Wo Contrast  Result Date: 12/04/2017 CLINICAL DATA:  Recurrent strokes. Worsening left leg weakness beginning yesterday. EXAM: MRI HEAD WITHOUT CONTRAST MRA HEAD WITHOUT CONTRAST TECHNIQUE: Multiplanar, multiecho pulse sequences of the brain and surrounding structures were obtained without intravenous contrast. Angiographic images of the head were obtained using MRA technique without contrast. COMPARISON:  CT 12/03/2017 FINDINGS: MRI HEAD FINDINGS Brain: Acute small  vessel infarction in the right posterior basal ganglia and radiating white matter tracts. No other acute finding. Chronic small-vessel ischemic changes affect the pons. Old small vessel cerebellar infarctions. Old bilateral occipital cortical and subcortical infarctions. Old cortical and subcortical infarctions in the right parietal lobe. Chronic small-vessel ischemic changes of the thalami, basal ganglia and hemispheric white matter. No mass lesion, hemorrhage, hydrocephalus or extra-axial collection. Vascular: Major vessels at the base of the brain show flow. Skull and upper cervical spine: Negative Sinuses/Orbits: Clear/normal Other: None MRA HEAD FINDINGS Both internal carotid arteries are widely patent through the skull base and siphon regions. The anterior and middle cerebral vessels are patent without proximal stenosis, aneurysm or vascular malformation. Distal vessels show atherosclerotic irregularity. Both vertebral arteries are patent with the left being dominant. Mild atherosclerotic irregularity of the V4 segments without stenosis. The basilar artery is widely patent. Superior cerebellar and posterior cerebral arteries are patent. Distal vessel atherosclerotic irregularity. IMPRESSION: Acute small vessel infarction in the posterior right basal ganglia and radiating white matter tracts. Old cortical infarctions affecting both occipital lobes in the right parietal region. Widespread chronic small-vessel ischemic changes throughout the brain. MRA does not show a large or medium vessel occlusion or correctable proximal stenosis. Distal vessel atherosclerotic irregularity diffusely. Electronically Signed   By: Paulina FusiMark  Shogry M.D.   On: 12/04/2017 11:46   [place] Carotid Bilateral (at [facility] And [facility] Only)  Result Date: 12/04/2017 CLINICAL DATA:  84 year old male with a history of cerebrovascular accident. Cardiovascular risk factors include stroke/TIA, hypertension, coronary artery disease, hyperlipidemia, diabetes,  smoking Given history of prior left endarterectomy EXAM: BILATERAL CAROTID DUPLEX ULTRASOUND TECHNIQUE: Wallace CullensGray scale imaging, color Doppler and duplex ultrasound were performed of bilateral carotid and vertebral arteries in the neck. COMPARISON:  No prior duplex FINDINGS: Criteria: Quantification of carotid stenosis is based on velocity parameters that correlate the residual internal carotid diameter with NASCET-based stenosis levels, using the diameter of the distal internal carotid lumen as the denominator for stenosis measurement. The following velocity measurements were obtained: RIGHT ICA:  Systolic 113 cm/sec, Diastolic 20 cm/sec CCA:  92 cm/sec SYSTOLIC ICA/CCA RATIO:  1.2 ECA:  88 cm/sec LEFT ICA:  Systolic 102 cm/sec, Diastolic 24 cm/sec CCA:  97 cm/sec SYSTOLIC ICA/CCA RATIO:  1.0 ECA: Occluded Right Brachial SBP: Not acquired Left Brachial SBP: Not acquired RIGHT CAROTID ARTERY: Significant calcifications of the right common carotid artery. Intermediate waveform maintained. Moderate heterogeneous and partially calcified plaque at the right carotid bifurcation. Lumen shadowing present. Low resistance waveform of the right ICA. Mild tortuosity RIGHT VERTEBRAL ARTERY: Antegrade flow with low resistance waveform. LEFT CAROTID ARTERY: No significant calcifications of the left common carotid artery. Intermediate waveform maintained. Moderate heterogeneous and partially calcified plaque at the left carotid bifurcation. No significant lumen shadowing. Low resistance waveform of the left ICA. No significant tortuosity. LEFT VERTEBRAL ARTERY:  Antegrade flow with low resistance waveform. IMPRESSION: Right: Heterogeneous and calcified plaque at the right carotid bifurcation, as well as within the common carotid artery, with no hemodynamically significant stenosis by established duplex criteria. Note that the flow velocities of the right ICA were obtained from an  area distal to the maximum narrowing due to the presence  of anterior wall plaque with shadowing and may be underestimating the percentage of ICA stenosis. If a more precise assessment is required, consider a formal cerebral angiogram or alternatively CT angiogram Left: There is a history of left carotid endarterectomy, and note that established duplex criteria have not been validated in the setting of postsurgical changes. However, the duplex study shows no evidence of significant restenosis or plaque. Signed, Yvone Neu. Loreta Ave, DO [facility] Electronically Signed   By: Gilmer Mor D.O.   On: 12/04/2017 10:55   Dg Chest Port 1 View  Result Date: 12/04/2017 CLINICAL DATA:  MRI clearance EXAM: PORTABLE CHEST 1 VIEW COMPARISON:  06/29/2009 FINDINGS: Enlargement of cardiac silhouette post median sternotomy. Multiple sternal wires present. Atherosclerotic calcification aorta. Mediastinal contours and pulmonary vascularity normal. Minimal bronchitic changes without pulmonary infiltrate, pleural effusion or pneumothorax. Deformity of multiple lateral LEFT ribs secondary to old fractures. Few gown snaps project over the shoulders and LEFT supraclavicular region. EKG lead lower RIGHT chest. No other metallic foreign bodies. IMPRESSION: Enlargement of cardiac silhouette post median sternotomy. No acute abnormalities. Electronically Signed   By: Ulyses Southward M.D.   On: 12/04/2017 09:17   Mr Maxine Glenn Head/brain ZO Cm  Result Date: 12/04/2017 CLINICAL DATA:  Recurrent strokes. Worsening left leg weakness beginning yesterday. EXAM: MRI HEAD WITHOUT CONTRAST MRA HEAD WITHOUT CONTRAST TECHNIQUE: Multiplanar, multiecho pulse sequences of the brain and surrounding structures were obtained without intravenous contrast. Angiographic images of the head were obtained using MRA technique without contrast. COMPARISON:  CT 12/03/2017 FINDINGS: MRI HEAD FINDINGS Brain: Acute small vessel infarction in the right posterior basal ganglia  and radiating white matter tracts. No other acute finding. Chronic small-vessel ischemic changes affect the pons. Old small vessel cerebellar infarctions. Old bilateral occipital cortical and subcortical infarctions. Old cortical and subcortical infarctions in the right parietal lobe. Chronic small-vessel ischemic changes of the thalami, basal ganglia and hemispheric white matter. No mass lesion, hemorrhage, hydrocephalus or extra-axial collection. Vascular: Major vessels at the base of the brain show flow. Skull and upper cervical spine: Negative Sinuses/Orbits: Clear/normal Other: None MRA HEAD FINDINGS Both internal carotid arteries are widely patent through the skull base and siphon regions. The anterior and middle cerebral vessels are patent without proximal stenosis, aneurysm or vascular malformation. Distal vessels show atherosclerotic irregularity. Both vertebral arteries are patent with the left being dominant. Mild atherosclerotic irregularity of the V4 segments without stenosis. The basilar artery is widely patent. Superior cerebellar and posterior cerebral arteries are patent. Distal vessel atherosclerotic irregularity. IMPRESSION: Acute small vessel infarction in the posterior right basal ganglia and radiating white matter tracts. Old cortical infarctions affecting both occipital lobes in the right parietal region. Widespread chronic small-vessel ischemic changes throughout the brain. MRA does not show a large or medium vessel occlusion or correctable proximal stenosis. Distal vessel atherosclerotic irregularity diffusely. Electronically Signed   By: Paulina Fusi M.D.   On: 12/04/2017 11:46   Ct Head Code Stroke Wo Contrast  Addendum Date: 12/03/2017   ADDENDUM REPORT: 12/03/2017 10:43 ADDENDUM: Study discussed by telephone with Dr. Daryel November on 12/03/2017 at 1040 hours. Electronically Signed   By: Odessa Fleming M.D.   On: 12/03/2017 10:43   Result Date: 12/03/2017 CLINICAL DATA:  Code stroke.  84 year old male with multiple recent falls. Slurred speech and dragging leg this morning, but went to bed normal at 2130 hours. EXAM: CT HEAD WITHOUT  CONTRAST TECHNIQUE: Contiguous axial images were obtained from the base of the skull through the vertex without intravenous contrast. COMPARISON:  None. FINDINGS: Brain: Confluent gray and white matter hypodensity in the right occipital pole compatible with acute to subacute right PCA territory infarct (series 3, image 18 and sagittal image 21. No associated hemorrhage or mass effect. Superimposed small superior right peri rolandic region cortical (series 3, image 26) and subcortical white matter hypodensity. There is also confluent subcortical white matter hypodensity in the contralateral left occipital pole. No definite associated cortical encephalomalacia. Additional underlying bilateral confluent cerebral white matter hypodensity with some deep white matter capsule involvement. There is a small linear chronic appearing infarct in the medial left cerebellum (series 3, image 8). No other cortically based acute infarct identified. No midline shift, ventriculomegaly, mass effect, evidence of mass lesion, or intracranial hemorrhage identified. Vascular: Extensive Calcified atherosclerosis at the skull base. No suspicious intracranial vascular hyperdensity. Skull: No acute osseous abnormality identified. Sinuses/Orbits: Clear. Other: No acute orbit or scalp soft tissue findings. ASPECTS [facility] Stroke Program Early CT Score) - Ganglionic level infarction (caudate, lentiform nuclei, internal capsule, insula, M1-M3 cortex): 7 - Supraganglionic infarction (M4-M6 cortex): 2 (abnormal superior M5 segment) Total score (0-10 with 10 being normal): 9 IMPRESSION: 1. Acute to subacute appearing cortical infarcts in the right occipital pole, but also the superior right perirolandic region. Query left side weakness. ASPECTS is 9 (abnormal superior right M5 segment). 2. No  associated hemorrhage or mass effect. 3. Underlying chronic appearing ischemia in the left occipital lobe and cerebellum, and widespread additional bilateral white matter hypodensity compatible with chronic small vessel disease. Electronically Signed: By: Odessa Fleming M.D. On: 12/03/2017 10:34   ASSESSMENT AND PLAN:  Shannon Ayala  is a 84 y.o. male with a known history of recurrent strokes, deafness metas, hypertension hyperlipidemia, history of MI, GERD is presenting to the ED with a chief complaint of worsening of left leg weakness.  CT head has revealed acute to subacute stroke  #Acute to subacute stroke with history of recurrent strokes in the past  CT head has revealed acute to subacute stroke in the right basal ganglia MRI/MRA of the brain carotid Dopplers noted.  2D echocardiogram pending results PT, OT evaluation Neurology consult appreciated. Continue aspirin and Plavix on high intensity statin  #Essential hypertension We will allow permissive hypertension will hold off his home medication hydrochlorothiazide lisinopril and Inderal  #Insulin requiring diabetes mellitus  hemoglobin A1c 10.0 Lantus nightly, decrease the dose to 40 units from 54 units which is his home dose Consult diabetic coordinator  #Hyperlipidemia - continue statin at high intensity dose   #History of coronary artery disease status post MI Continue aspirin Plavix statin Holding Inderal and lisinopril in view of acute stroke to allow permissive hypertension  #Tobacco chewing disorder Counseled patient to stop chewing tobacco for 4-5 minutes.  Patient verbalized understanding but not considering nicotine patch   Case discussed with Care Management/Social Worker.   CODE STATUS: full DVT Prophylaxis: Lovenox  TOTAL TIME TAKING CARE OF THIS PATIENT: 30 minutes.  >50% time spent on counselling and coordination of care  POSSIBLE D/C IN 1-2 DAYS, DEPENDING ON CLINICAL CONDITION.  Note: This  dictation was prepared with Dragon dictation along with smaller phrase technology. Any transcriptional errors that result from this process are unintentional.  Enedina Finner M.D on 12/04/2017 at 3:04 PM  Between 7am to 6pm - Pager - [phone]  After 6pm go to [email or web address] - password [email or web address]  [facility]  Office  [phone]  CC: Primary care physician; System, Pcp Not InPatient ID: Shannon Ayala, male   DOB: 09/27/1923, 84 y.o.   MRN: [id number]

## 2017-12-04 NOTE — Progress Notes (Signed)
*  PRELIMINARY RESULTS* Echocardiogram 2D Echocardiogram has been performed.  Shannon GulaJoan M Ranika Ayala 12/04/2017, 2:34 PM

## 2017-12-04 NOTE — Evaluation (Signed)
Occupational Therapy Evaluation Patient Details Name: Shannon Ayala MRN: [id number] DOB: [birth year] Today's Date: 12/04/2017    History of Present Illness 84yo male pt admitted for acute CVA. 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 Pt with complaints of L side weakness with multiple falls in past few weeks. Pt reports no vision changes. History includes depression, DM, HTN, MI, CVA, and GERD.    Clinical Impression   Pt seen for OT evaluation this date. Pt is a poor historian and no family present to help determine baseline cognitive status. Pt reports living with his son who helps him with bathing, dressing, meals, and set up of food items (cuts, prepares). Pt with history of multiple falls. Pt pleasant and oriented to self and generally to place. Pt very HOH even with hearing aides. Question whether part of this is possible receptive language deficit. Pt follows simple commands with cues and additional time to process information, demonstrates poor safety awareness and judgment during meal time. Pt requested OT cut up his [place] w/ gravy, as his son does this for him at home. OT prepared food and cut small pieces. Pt proceeded to take several bites of [place], then a bite of sweet potato and then green beans prior to swallowing previous bites. When encouraged not to, pt states "I want a little bit of everything in each bite" but then endorses getting "choked up" frequently at home and demonstrates throat clearing during session. Additional verbal cues provided. RN notified of OT's concerns of pt's safety with meal and suggested a swallow evaluation from SLP be ordered if MD deems appropriate. It appears pt has a history of getting choked up and throat clearing, over stuffs and does not exhibit basic safety precautions while eating to minimize his risk of aspiration. To support maximal independence, OT minimized auditory and visual distractions in room, provided high contrast set up of plate,  utensils, and drink on tray table with noted improvement in ability to successfully load utensil with food and bring to mouth. Pt will benefit from skilled OT services to address noted impairments and functional deficits in order to maximize independence and safety and minimize falls risk and caregiver burden. Recommend transition to STR for additional therapy with 24/7 supervision/assist.    Follow Up Recommendations  SNF;Supervision/Assistance - 24 hour    Equipment Recommendations       Recommendations for Other Services Speech consult(SLP swallow eval)     Precautions / Restrictions Precautions Precautions: Fall Restrictions Weight Bearing Restrictions: No      Mobility Bed Mobility               General bed mobility comments: deferred, up in recliner for session  Transfers                 General transfer comment: deferred, up in recliner for session    Balance Overall balance assessment: History of Falls                                         ADL either performed or assessed with clinical judgement   ADL Overall ADL's : Needs assistance/impaired Eating/Feeding: Sitting;Set up;Supervision/ safety;Cueing for safety Eating/Feeding Details (indicate cue type and reason): OT cut and prepared all meal items ([place] w/ gravy, sweet potato, green beans). Poor judgment/safety awareness noted when pt endorsed getting "choked up" frequently and when asked by therapist to take smaller  bites and chew and swallow between bites instead of over stuffing, pt stated "I want a little bit of everything in each bite."  Pt noted with throat clearing and cough ocassionally. Notified RN. SLP Swallow evaluation is recommended. Grooming: Sitting;Set up;Cueing for sequencing   Upper Body Bathing: Sitting;Moderate assistance   Lower Body Bathing: Sit to/from stand;Maximal assistance   Upper Body Dressing : Sitting;Moderate assistance   Lower Body Dressing: Sit  to/from stand;Maximal assistance     Toilet Transfer Details (indicate cue type and reason): unsafe to attempt                  Vision Baseline Vision/History: Wears glasses Wears Glasses: At all times Patient Visual Report: No change from baseline(poor historian, but reports no difficulties) Vision Assessment?: No apparent visual deficits Additional Comments: difficult to assess due to cognition     Perception     Praxis      Pertinent Vitals/Pain Pain Assessment: No/denies pain     Hand Dominance Right   Extremity/Trunk Assessment Upper Extremity Assessment Upper Extremity Assessment: Generalized weakness;RUE deficits/detail;Difficult to assess due to impaired cognition RUE Deficits / Details: 4+/5 grossly LUE Deficits / Details: grip 4-/5; elbow flexor/extensor 4/5, able to lift LUE off recliner; pt tends to leave LUE under blanket and utilize RUE for all things, including reaching cross body to grab a tissue LUE Coordination: decreased gross motor;decreased fine motor   Lower Extremity Assessment Lower Extremity Assessment: Defer to PT evaluation;Difficult to assess due to impaired cognition;Generalized weakness       Communication Communication Communication: HOH(very HOH, possible receptive cognitive difficulties)   Cognition Arousal/Alertness: Awake/alert Behavior During Therapy: WFL for tasks assessed/performed Overall Cognitive Status: No family/caregiver present to determine baseline cognitive functioning                                 General Comments: Poor historian, remote chart review from 2018 indicates memory loss, pt able to follow simple commands inconsistently, difficulty with problem solving, awareness of deficits, safety awareness    General Comments       Exercises Other Exercises Other Exercises: For dinner meal, OT minimized auditory and visual distractions in room, provided high contrast set up of plate, utensils, and  drink on tray table. Pt required several verbal cues for safety while eating.    Shoulder Instructions      Home Living Family/patient expects to be discharged to:: Private residence Living Arrangements: Children(son) Available Help at Discharge: Family;Available PRN/intermittently Type of Home: House Home Access: Stairs to enter Entergy CorporationEntrance Stairs-Number of Steps: 1 step to enter onto porch and then 1 step to enter home Entrance Stairs-Rails: None Home Layout: Able to live on main level with bedroom/bathroom               Home Equipment: Walker - 4 wheels;Cane - single point   Additional Comments: pt poor historian, info provided by chart review      Prior Functioning/Environment Level of Independence: Needs assistance  Gait / Transfers Assistance Needed: previously using SPC, multiple recent falls ADL's / Homemaking Assistance Needed: lives w/ son who provides assist, pt reports "sometimes" needs help with dressing, son assists with bathing Communication / Swallowing Assistance Needed: pt endorses getting "choked up" frequently when eating, son prepares and cuts up all foods          OT Problem List: Decreased strength;Decreased knowledge of use of DME or AE;Decreased safety  awareness;Impaired balance (sitting and/or standing);Decreased cognition;Decreased activity tolerance      OT Treatment/Interventions: Self-care/ADL training;Therapeutic activities;Therapeutic exercise;Cognitive remediation/compensation;DME and/or AE instruction;Patient/family education    OT Goals(Current goals can be found in the care plan section) Acute Rehab OT Goals Patient Stated Goal: to get stronger OT Goal Formulation: With patient Time For Goal Achievement: 12/18/17 Potential to Achieve Goals: Good ADL Goals Pt Will Perform Eating: with supervision;sitting;with set-up(minimal distractions, small bites, cut food) Pt Will Transfer to Toilet: with min guard assist;bedside commode(LRAD for amb,  simple VC for sequencing)  OT Frequency: Min 1X/week   Barriers to D/C:            Co-evaluation              AM-PAC PT "6 Clicks" Daily Activity     Outcome Measure Help from another person eating meals?: A Little Help from another person taking care of personal grooming?: A Little Help from another person toileting, which includes using toliet, bedpan, or urinal?: A Lot Help from another person bathing (including washing, rinsing, drying)?: A Lot Help from another person to put on and taking off regular upper body clothing?: A Lot Help from another person to put on and taking off regular lower body clothing?: A Lot 6 Click Score: 14   End of Session Nurse Communication: Other (comment)(notified of meal status and possible need for SLP swallow eval)  Activity Tolerance: Patient tolerated treatment well Patient left: in chair;with call bell/phone within reach;with chair alarm set  OT Visit Diagnosis: Repeated falls (R29.6);Muscle weakness (generalized) (M62.81);Other symptoms and signs involving cognitive function;Unsteadiness on feet (R26.81)                Time: 1610-9604 OT Time Calculation (min): 21 min Charges:  OT General Charges $OT Visit: 1 Visit OT Evaluation $OT Eval Moderate Complexity: 1 Mod OT Treatments $Self Care/Home Management : 8-22 mins  Richrd Prime, MPH, MS, OTR/L ascom [phone] 12/04/17, 6:11 PM

## 2017-12-04 NOTE — Progress Notes (Signed)
Son at bedside, would like to be called and updated tomorrow regarding plan of care, test results and discharge planning. Bo McclintockBrewer,Reiley Keisler S, RN

## 2017-12-05 LAB — GLUCOSE, CAPILLARY
GLUCOSE-CAPILLARY: 313 mg/dL — AB (ref 65–99)
GLUCOSE-CAPILLARY: 217 mg/dL — AB (ref 65–99)
Glucose-Capillary: 175 mg/dL — ABNORMAL HIGH (ref 65–99)
Glucose-Capillary: 189 mg/dL — ABNORMAL HIGH (ref 65–99)
Glucose-Capillary: 184 mg/dL — ABNORMAL HIGH (ref 65–99)

## 2017-12-05 MED ORDER — INSULIN GLARGINE 100 UNIT/ML ~~LOC~~ SOLN
34.0000 [IU] | Freq: Every evening | SUBCUTANEOUS | Status: DC
  Administered 2017-12-05: 34 [IU] via SUBCUTANEOUS
  Filled 2017-12-05 (×2): qty 0.34

## 2017-12-05 NOTE — Progress Notes (Signed)
Occupational Therapy Treatment Patient Details Name: Shannon Ayala MRN: [id number] DOB: 1924/04/25 Today's Date: 12/05/2017    History of present illness 84yo male pt admitted for acute CVA. 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 Pt with complaints of L side weakness with multiple falls in past few weeks. Pt reports no vision changes. History includes depression, DM, HTN, MI, CVA, and GERD.    OT comments  Pt up in recliner, pleased to see OT and agreeable to session. Pt denies pain and only complains of LUE weakness that has continued since yesterday. Neuro re-ed for LUE performed with pt actively reaching within and outside BOS with weight shifting and trunk stabilization with verbal cues and visual cues for intended targets. Lunch arrived (baked chicken cut with gravy alongside mashed potatoes and chopped collards). OT provided set up with minimal visual and auditory distractions, removing unnecessary items from the tray table, muting tv, and provided set up of butter, seasoning as pt would not attempt and would request OT. With min assist, pt was able to open applesauce by holding cup with L hand and with the lid loosened slightly by OT was able to complete opening. Pt continues to demonstrate poor insight into aspiration risk as he takes small bites of each item on his plate at a time and was observed to pocket food in cheek as well. No overt coughing or throat clearing noted this date. SLP in room to further assess at end of OT session. RN in room to administer medication and instructed in techniques to encourage improved muscle activation with LLE during transfers to improve safety and minimize falls and minimize caregiver burden. Will continue to progress.   Follow Up Recommendations  SNF;Supervision/Assistance - 24 hour    Equipment Recommendations       Recommendations for Other Services      Precautions / Restrictions Precautions Precautions: Fall Restrictions Weight Bearing  Restrictions: No       Mobility Bed Mobility               General bed mobility comments: deferred, up in recliner for session  Transfers                 General transfer comment: deferred, up in recliner for session    Balance                                           ADL either performed or assessed with clinical judgement   ADL Overall ADL's : Needs assistance/impaired Eating/Feeding: Sitting;Set up;Supervision/ safety;Cueing for safety Eating/Feeding Details (indicate cue type and reason): OT prepared meal items on tray table. Poor judgment/safety awareness noted again this date with over stuffing, pocketing, and attempting to talk before swallowing.                                          Vision       Perception     Praxis      Cognition Arousal/Alertness: Awake/alert Behavior During Therapy: WFL for tasks assessed/performed Overall Cognitive Status: No family/caregiver present to determine baseline cognitive functioning  General Comments: Poor historian, remote chart review from 2018 indicates memory loss, pt able to follow simple commands inconsistently, difficulty with problem solving, awareness of deficits, safety awareness         Exercises Other Exercises Other Exercises: Pt performed AROM and reaching outside BOS with LUE in all planes with visual cue for target and verbal cue to initiate. Pt with no complaints of sensory deficits in LUE.  Other Exercises: RN educated in ability to provide proprioceptive input proximal to the L knee to improve muscle contraction and stability while transferring.    Shoulder Instructions       General Comments      Pertinent Vitals/ Pain       Pain Assessment: No/denies pain  Home Living                                          Prior Functioning/Environment              Frequency  Min 1X/week         Progress Toward Goals  OT Goals(current goals can now be found in the care plan section)  Progress towards OT goals: OT to reassess next treatment  Acute Rehab OT Goals Patient Stated Goal: to get stronger OT Goal Formulation: With patient Time For Goal Achievement: 12/18/17 Potential to Achieve Goals: Good  Plan Discharge plan remains appropriate;Frequency remains appropriate    Co-evaluation                 AM-PAC PT "6 Clicks" Daily Activity     Outcome Measure   Help from another person eating meals?: A Little Help from another person taking care of personal grooming?: A Little Help from another person toileting, which includes using toliet, bedpan, or urinal?: A Lot Help from another person bathing (including washing, rinsing, drying)?: A Lot Help from another person to put on and taking off regular upper body clothing?: A Lot Help from another person to put on and taking off regular lower body clothing?: A Lot 6 Click Score: 14    End of Session    OT Visit Diagnosis: Repeated falls (R29.6);Muscle weakness (generalized) (M62.81);Other symptoms and signs involving cognitive function;Unsteadiness on feet (R26.81)   Activity Tolerance Patient tolerated treatment well   Patient Left in chair;with call bell/phone within reach;with chair alarm set;Other (comment)(with SLP in room for assessment)   Nurse Communication          Time: [id number] OT Time Calculation (min): 29 min  Charges: OT General Charges $OT Visit: 1 Visit OT Treatments $Self Care/Home Management : 8-22 mins $Neuromuscular Re-education: 8-22 mins   Richrd PrimeJamie Stiller, MPH, MS, OTR/L ascom [phone] 12/05/17, 1:19 PM

## 2017-12-05 NOTE — Progress Notes (Signed)
   12/05/17 0731  Clinical Encounter Type  Visited With Patient  Visit Type Initial  Referral From Nurse  Consult/Referral To Chaplain  Spiritual Encounters  Spiritual Needs Prayer   CH received an OR to complete an AD with PT. After several attempts CH was unable to catch PT in RM, Awake, or able to fully understand what an AD was. CH will inform on-call CH. CH will follow up at a later time.

## 2017-12-05 NOTE — Progress Notes (Signed)
Physical Therapy Treatment Patient Details Name: Shannon Ayala MRN: [id number] DOB: [birth year] Today's Date: 12/05/2017    History of Present Illness 84yo male pt admitted for acute CVA. 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 Pt with complaints of L side weakness with multiple falls in past few weeks. Pt reports no vision changes. History includes depression, DM, HTN, MI, CVA, and GERD.     PT Comments    Pt able to perform seated exercises with frequent VC's for upright posture and body mechanics.  He performed STS transfer with min-mod A for initiation of movement and VC's for upright posture.  PT assisted pt to [facility], mod A for avoidance of posterior lean  And VC's for hand placement.  Pt was able to complete transfers and follow commands without report of pain.  PT introduced seated reverse chop exercises, marching and reaching outside of BOS to pt and education concerning importance of upright posture with exercises.  Pt is HOH but able to follow simple commands.  He has a tendency to return to posterior lean in standing and sitting shortly following VC. Pt will continue to benefit from skilled PT with focus on strength, balance, tolerance to activity and balance.  Follow Up Recommendations  SNF     Equipment Recommendations  Other (comment)(to be determined at next venue of care.)    Recommendations for Other Services       Precautions / Restrictions Precautions Precautions: Fall Restrictions Weight Bearing Restrictions: No    Mobility  Bed Mobility Overal bed mobility: (In recliner.  Did not perform.)             General bed mobility comments: deferred, up in recliner for session  Transfers Overall transfer level: Needs assistance Equipment used: Rolling walker (2 wheeled) Transfers: Sit to/from [employer] Sit to Stand: Mod assist Stand pivot transfers: Mod assist       General transfer comment: Required min-mod A to initiate STS and heavy  VC's for upright posture and to avoid posterior lean against chair.  PT provided mod A during standing pivot, providing heavy VC's for hand placement and physical assist for avoidance of posterior lean.  Ambulation/Gait                 Stairs             Wheelchair Mobility    Modified Rankin (Stroke Patients Only)       Balance Overall balance assessment: Needs assistance Sitting-balance support: Bilateral upper extremity supported;Feet supported     Postural control: Posterior lean Standing balance support: Bilateral upper extremity supported   Standing balance comment: Pt requires frequent cueing for avoidance of posterior lean in sitting and standing.                            Cognition Arousal/Alertness: Awake/alert Behavior During Therapy: WFL for tasks assessed/performed Overall Cognitive Status: No family/caregiver present to determine baseline cognitive functioning                                 General Comments: Poor historian, remote chart review from 2018 indicates memory loss, pt able to follow simple commands inconsistently, difficulty with problem solving, awareness of deficits, safety awareness       Exercises Other Exercises Other Exercises: STS with VC's for sequencing and optimal body mechanics along with min-mod A to initiate movement.  Pt practices standing with upright posture and avoidance of posterior lean against chair for balance. Other Exercises: Reverse chop in D1 flexion and extension exercises x5 in each direction with manual resistance from PT Other Exercises: seated marching with manual resistance x20 wtih VC's for upright posture.    General Comments        Pertinent Vitals/Pain Pain Assessment: No/denies pain    Home Living                      Prior Function            PT Goals (current goals can now be found in the care plan section) Acute Rehab PT Goals Patient Stated Goal: to  get stronger PT Goal Formulation: With patient Progress towards PT goals: Progressing toward goals    Frequency    7X/week      PT Plan Current plan remains appropriate    Co-evaluation              AM-PAC PT "6 Clicks" Daily Activity  Outcome Measure  Difficulty turning over in bed (including adjusting bedclothes, sheets and blankets)?: A Lot Difficulty moving from lying on back to sitting on the side of the bed? : A Lot Difficulty sitting down on and standing up from a chair with arms (e.g., wheelchair, bedside commode, etc,.)?: A Lot Help needed moving to and from a bed to chair (including a wheelchair)?: A Lot Help needed walking in hospital room?: A Lot Help needed climbing 3-5 steps with a railing? : A Lot 6 Click Score: 12    End of Session Equipment Utilized During Treatment: Gait belt Activity Tolerance: Patient limited by fatigue Patient left: in chair;with call bell/phone within reach;with chair alarm set   PT Visit Diagnosis: Unsteadiness on feet (R26.81);Muscle weakness (generalized) (M62.81) Hemiplegia - Right/Left: Left Hemiplegia - dominant/non-dominant: Non-dominant Hemiplegia - caused by: Cerebral infarction     Time: 1400-1425 PT Time Calculation (min) (ACUTE ONLY): 25 min  Charges:  $Therapeutic Exercise: 8-22 mins $Therapeutic Activity: 8-22 mins                    G Codes:  Functional Assessment Tool Used: AM-PAC 6 Clicks Basic Mobility    Glenetta HewSarah Jolly Bleicher, PT, DPT    Glenetta HewSarah Favour Aleshire 12/05/2017, 2:43 PM

## 2017-12-05 NOTE — [place] FL2 (Signed)
[place] MEDICAID FL2 LEVEL OF CARE SCREENING TOOL     IDENTIFICATION  Patient Name: Shannon Ayala Birthdate: [birth year] Sex: male Admission Date (Current Location): 12/03/2017  [place]   Facility and Address:  [facility], [address], [place], [place] [address]      Provider Number: [id number]  Attending Physician Name and Address:  Milagros LollSudini, Srikar, MD  Relative Name and Phone Number:       Current Level of Care: Hospital Recommended Level of Care: Skilled Nursing Facility Prior Approval Number:    Date Approved/Denied:   PASRR Number: ([id number] )  Discharge Plan: SNF    Current Diagnoses: Patient Active Problem List   Diagnosis Date Noted  . Acute CVA (cerebrovascular accident) (HCC) 12/03/2017    Orientation RESPIRATION BLADDER Height & Weight     Self, Time, Situation, Place  Normal Continent Weight: 183 lb 6.4 oz (83.2 kg) Height:  5\' 6"  (167.6 cm)  BEHAVIORAL SYMPTOMS/MOOD NEUROLOGICAL BOWEL NUTRITION STATUS      Continent Diet(Diet: Carb Modified. )  AMBULATORY STATUS COMMUNICATION OF NEEDS Skin   Extensive Assist Verbally Normal                       Personal Care Assistance Level of Assistance  Bathing, Feeding, Dressing Bathing Assistance: Limited assistance Feeding assistance: Independent Dressing Assistance: Limited assistance     Functional Limitations Info  Sight, Hearing, Speech Sight Info: Adequate Hearing Info: Impaired Speech Info: Adequate    SPECIAL CARE FACTORS FREQUENCY  PT (By licensed PT), OT (By licensed OT)     PT Frequency: (5) OT Frequency: (5)            Contractures      Additional Factors Info  Code Status, Allergies Code Status Info: (Full Code. ) Allergies Info: (Codeine, Darvon Propoxyphene, Morphine)           Current Medications (12/05/2017):  This is the current hospital active medication list Current Facility-Administered  Medications  Medication Dose Route Frequency Provider Last Rate Last Dose  . acetaminophen (TYLENOL) tablet 650 mg  650 mg Oral Q4H PRN Gouru, Aruna, MD       Or  . acetaminophen (TYLENOL) solution 650 mg  650 mg Per Tube Q4H PRN Gouru, Aruna, MD       Or  . acetaminophen (TYLENOL) suppository 650 mg  650 mg Rectal Q4H PRN Gouru, Aruna, MD      . aspirin EC tablet 81 mg  81 mg Oral Daily Gouru, Aruna, MD   81 mg at 12/05/17 0846  . clopidogrel (PLAVIX) tablet 75 mg  75 mg Oral QPM Gouru, Aruna, MD   75 mg at 12/04/17 1745  . enoxaparin (LOVENOX) injection 40 mg  40 mg Subcutaneous Q24H Gouru, Aruna, MD   40 mg at 12/04/17 1746  . escitalopram (LEXAPRO) tablet 10 mg  10 mg Oral Daily Gouru, Aruna, MD   10 mg at 12/05/17 0845  . insulin aspart (novoLOG) injection 0-5 Units  0-5 Units Subcutaneous QHS Ramonita LabGouru, Aruna, MD   2 Units at 12/04/17 2235  . insulin aspart (novoLOG) injection 0-9 Units  0-9 Units Subcutaneous TID WC Ramonita LabGouru, Aruna, MD   2 Units at 12/05/17 0846  . insulin glargine (LANTUS) injection 30 Units  30 Units Subcutaneous QPM Enedina FinnerPatel, Sona, MD   30 Units at 12/04/17 2236  . isosorbide mononitrate (IMDUR) 24 hr tablet 30 mg  30 mg Oral QPM Gouru, Aruna,  MD   30 mg at 12/04/17 1745  . pantoprazole (PROTONIX) EC tablet 40 mg  40 mg Oral BID AC Gouru, Aruna, MD   40 mg at 12/05/17 0845  . senna-docusate (Senokot-S) tablet 1 tablet  1 tablet Oral QHS PRN Gouru, Aruna, MD      . vitamin B-12 (CYANOCOBALAMIN) tablet 100 mcg  100 mcg Oral Daily Gouru, Aruna, MD   100 mcg at 12/05/17 0845     Discharge Medications: Please see discharge summary for a list of discharge medications.  Relevant Imaging Results:  Relevant Lab Results:   Additional Information (SSN: [id number])  Jacy Brocker, Darleen Crocker, LCSW

## 2017-12-05 NOTE — Clinical Social Work Placement (Signed)
   CLINICAL SOCIAL WORK PLACEMENT  NOTE  Date:  12/05/2017  Patient Details  Name: Shannon Ayala MRN: [id number] Date of Birth: 07-27-1924  Clinical Social Work is seeking post-discharge placement for this patient at the Skilled  Nursing Facility level of care (*CSW will initial, date and re-position this form in  chart as items are completed):  Yes   Patient/family provided with [facility] Clinical Social Work Department's list of facilities offering this level of care within the geographic area requested by the patient (or if unable, by the patient's family).  Yes   Patient/family informed of their freedom to choose among providers that offer the needed level of care, that participate in Medicare, Medicaid or managed care program needed by the patient, have an available bed and are willing to accept the patient.  Yes   Patient/family informed of [facility]'s ownership interest in [facility] and [facility], as well as of the fact that they are under no obligation to receive care at these facilities.  PASRR submitted to EDS on       PASRR number received on       Existing PASRR number confirmed on 12/05/17     FL2 transmitted to all facilities in geographic area requested by pt/family on 12/05/17     FL2 transmitted to all facilities within larger geographic area on       Patient informed that his/her managed care company has contracts with or will negotiate with certain facilities, including the following:        Yes   Patient/family informed of bed offers received.  Patient chooses bed at [facility] )     Physician recommends and patient chooses bed at      Patient to be transferred to   on  .  Patient to be transferred to facility by       Patient family notified on   of transfer.  Name of family member notified:        PHYSICIAN       Additional Comment:    _______________________________________________ Fina Heizer, Darleen CrockerBailey M,  LCSW 12/05/2017, 4:12 PM

## 2017-12-05 NOTE — Progress Notes (Signed)
Subjective: Patient unchanged.  No new neurological complaints.  Continued left sided weakness.  Objective: Current vital signs: BP (!) 142/73 (BP Location: Right Arm)   Pulse 73   Temp (!) 97.5 F (36.4 C) (Oral)   Resp 20   Ht 5\' 6"  (1.676 m)   Wt 83.2 kg (183 lb 6.4 oz)   SpO2 96%   BMI 29.60 kg/m  Vital signs in last 24 hours: Temp:  [97.5 F (36.4 C)-98.5 F (36.9 C)] 97.5 F (36.4 C) (04/19 0911) Pulse Rate:  [67-77] 73 (04/19 0911) Resp:  [16-20] 20 (04/19 0911) BP: (123-165)/(61-80) 142/73 (04/19 0911) SpO2:  [94 %-97 %] 96 % (04/19 0911)  Intake/Output from previous day: 04/18 0701 - 04/19 0700 In: 240 [P.O.:240] Out: -  Intake/Output this shift: Total I/O In: 240 [P.O.:240] Out: -  Nutritional status: Fall precautions Aspiration precautions Diet Carb Modified Fluid consistency: Thin; Room service appropriate? Yes  Neurologic Exam: Mental Status: Alert, oriented, thought content appropriate. Speech fluent without evidence of aphasia. Dysarthric.Able to follow 3 step commands with some reinforcement. Cranial Nerves: II: Discs flat bilaterally; Visual fields grossly normal, pupils equal, round, reactive to light and accommodation III,IV, VI: ptosis not present, extra-ocular motions intact bilaterally V,VII:mild left facial drooop, facial light touch sensation normal bilaterally VIII: hearing normal bilaterally IX,X: gag reflex present XI: bilateral shoulder shrug XII: midline tongue extension Motor: Right :Upper extremity 5/5Left: Upper extremity 5-/5 Lower extremity 5/5Lower extremity 4-/5   Lab Results: Basic Metabolic Panel: Recent Labs  Lab 12/03/17 1036  NA 135  K 4.2  CL 103  CO2 28  GLUCOSE 351*  BUN 18  CREATININE 0.79  CALCIUM 9.3    Liver Function Tests: Recent Labs  Lab 12/03/17 1036  AST 20  ALT 10*   ALKPHOS 97  BILITOT 0.9  PROT 6.6  ALBUMIN 3.3*   No results for input(s): LIPASE, AMYLASE in the last 168 hours. No results for input(s): AMMONIA in the last 168 hours.  CBC: Recent Labs  Lab 12/03/17 1036  WBC 7.7  NEUTROABS 4.3  HGB 13.6  HCT 41.1  MCV 91.4  PLT 215    Cardiac Enzymes: Recent Labs  Lab 12/03/17 1036  TROPONINI <0.03    Lipid Panel: Recent Labs  Lab 12/04/17 0427  CHOL 121  TRIG 124  HDL 27*  CHOLHDL 4.5  VLDL 25  LDLCALC 69    CBG: Recent Labs  Lab 12/04/17 1202 12/04/17 1252 12/04/17 1706 12/04/17 2109 12/05/17 0744  GLUCAP 281* 300* 250* 245* 175*    Microbiology: No results found for this or any previous visit.  Coagulation Studies: Recent Labs    12/03/17 1036  LABPROT 12.9  INR 0.98    Imaging: Dg Eye Foreign Body  Result Date: 12/04/2017 CLINICAL DATA:  Metal working/exposure; clearance prior to MRI EXAM: ORBITS FOR FOREIGN BODY - 2 VIEW COMPARISON:  None. FINDINGS: There is no evidence of metallic foreign body within the orbits. No significant bone abnormality identified. Bilateral hearing aids noted. IMPRESSION: No evidence of metallic foreign body within the orbits. Bilateral hearing aids noted. Electronically Signed   By: Charlett NoseKevin  Dover M.D.   On: 12/04/2017 09:12   Dg Neck Soft Tissue  Result Date: 12/04/2017 CLINICAL DATA:  Evaluate for possible retained metal fragments EXAM: NECK SOFT TISSUES - 1+ VIEW COMPARISON:  None. FINDINGS: Degenerative changes of the cervical spine are noted. Postoperative clips are noted within the right neck. Bilateral hearing aids are again noted. Postsurgical changes in  the sternum are noted. No other focal abnormality is noted. IMPRESSION: Postsurgical changes as described. Electronically Signed   By: Alcide Clever M.D.   On: 12/04/2017 09:21   Dg Abd 1 View  Result Date: 12/04/2017 CLINICAL DATA:  Evaluate for retained metal for MRI clearance EXAM: ABDOMEN - 1 VIEW COMPARISON:  None.  FINDINGS: Scattered large and small bowel gas is noted. No abnormal mass or abnormal calcifications are seen. Degenerative changes of the lumbar spine are noted. No retained metallic densities are identified. IMPRESSION: No definitive retained metal foreign body Electronically Signed   By: Alcide Clever M.D.   On: 12/04/2017 09:21   Mr Brain Wo Contrast  Result Date: 12/04/2017 CLINICAL DATA:  Recurrent strokes. Worsening left leg weakness beginning yesterday. EXAM: MRI HEAD WITHOUT CONTRAST MRA HEAD WITHOUT CONTRAST TECHNIQUE: Multiplanar, multiecho pulse sequences of the brain and surrounding structures were obtained without intravenous contrast. Angiographic images of the head were obtained using MRA technique without contrast. COMPARISON:  CT 12/03/2017 FINDINGS: MRI HEAD FINDINGS Brain: Acute small vessel infarction in the right posterior basal ganglia and radiating white matter tracts. No other acute finding. Chronic small-vessel ischemic changes affect the pons. Old small vessel cerebellar infarctions. Old bilateral occipital cortical and subcortical infarctions. Old cortical and subcortical infarctions in the right parietal lobe. Chronic small-vessel ischemic changes of the thalami, basal ganglia and hemispheric white matter. No mass lesion, hemorrhage, hydrocephalus or extra-axial collection. Vascular: Major vessels at the base of the brain show flow. Skull and upper cervical spine: Negative Sinuses/Orbits: Clear/normal Other: None MRA HEAD FINDINGS Both internal carotid arteries are widely patent through the skull base and siphon regions. The anterior and middle cerebral vessels are patent without proximal stenosis, aneurysm or vascular malformation. Distal vessels show atherosclerotic irregularity. Both vertebral arteries are patent with the left being dominant. Mild atherosclerotic irregularity of the V4 segments without stenosis. The basilar artery is widely patent. Superior cerebellar and posterior  cerebral arteries are patent. Distal vessel atherosclerotic irregularity. IMPRESSION: Acute small vessel infarction in the posterior right basal ganglia and radiating white matter tracts. Old cortical infarctions affecting both occipital lobes in the right parietal region. Widespread chronic small-vessel ischemic changes throughout the brain. MRA does not show a large or medium vessel occlusion or correctable proximal stenosis. Distal vessel atherosclerotic irregularity diffusely. Electronically Signed   By: Paulina Fusi M.D.   On: 12/04/2017 11:46   US Carotid Bilateral (at [facility] And [facility] Only)  Result Date: 12/04/2017 CLINICAL DATA:  84 year old male with a history of cerebrovascular accident. Cardiovascular risk factors include stroke/TIA, hypertension, coronary artery disease, hyperlipidemia, diabetes, smoking Given history of prior left endarterectomy EXAM: BILATERAL CAROTID DUPLEX ULTRASOUND TECHNIQUE: Wallace Cullens scale imaging, color Doppler and duplex ultrasound were performed of bilateral carotid and vertebral arteries in the neck. COMPARISON:  No prior duplex FINDINGS: Criteria: Quantification of carotid stenosis is based on velocity parameters that correlate the residual internal carotid diameter with NASCET-based stenosis levels, using the diameter of the distal internal carotid lumen as the denominator for stenosis measurement. The following velocity measurements were obtained: RIGHT ICA:  Systolic 113 cm/sec, Diastolic 20 cm/sec CCA:  92 cm/sec SYSTOLIC ICA/CCA RATIO:  1.2 ECA:  88 cm/sec LEFT ICA:  Systolic 102 cm/sec, Diastolic 24 cm/sec CCA:  97 cm/sec SYSTOLIC ICA/CCA RATIO:  1.0 ECA: Occluded Right Brachial SBP: Not acquired Left Brachial SBP: Not acquired RIGHT CAROTID ARTERY: Significant calcifications of the right common carotid artery. Intermediate waveform maintained. Moderate heterogeneous and partially calcified plaque at  the right carotid bifurcation. Lumen shadowing present. Low resistance  waveform of the right ICA. Mild tortuosity RIGHT VERTEBRAL ARTERY: Antegrade flow with low resistance waveform. LEFT CAROTID ARTERY: No significant calcifications of the left common carotid artery. Intermediate waveform maintained. Moderate heterogeneous and partially calcified plaque at the left carotid bifurcation. No significant lumen shadowing. Low resistance waveform of the left ICA. No significant tortuosity. LEFT VERTEBRAL ARTERY:  Antegrade flow with low resistance waveform. IMPRESSION: Right: Heterogeneous and calcified plaque at the right carotid bifurcation, as well as within the common carotid artery, with no hemodynamically significant stenosis by established duplex criteria. Note that the flow velocities of the right ICA were obtained from an area distal to the maximum narrowing due to the presence of anterior wall plaque with shadowing and may be underestimating the percentage of ICA stenosis. If a more precise assessment is required, consider a formal cerebral angiogram or alternatively CT angiogram Left: There is a history of left carotid endarterectomy, and note that established duplex criteria have not been validated in the setting of postsurgical changes. However, the duplex study shows no evidence of significant restenosis or plaque. Signed, Yvone Neu. Loreta Ave, DO [facility] Electronically Signed   By: Gilmer Mor D.O.   On: 12/04/2017 10:55   Dg Chest Port 1 View  Result Date: 12/04/2017 CLINICAL DATA:  MRI clearance EXAM: PORTABLE CHEST 1 VIEW COMPARISON:  06/29/2009 FINDINGS: Enlargement of cardiac silhouette post median sternotomy. Multiple sternal wires present. Atherosclerotic calcification aorta. Mediastinal contours and pulmonary vascularity normal. Minimal bronchitic changes without pulmonary infiltrate, pleural effusion or pneumothorax. Deformity of multiple lateral LEFT ribs secondary to old fractures. Few gown snaps  project over the shoulders and LEFT supraclavicular region. EKG lead lower RIGHT chest. No other metallic foreign bodies. IMPRESSION: Enlargement of cardiac silhouette post median sternotomy. No acute abnormalities. Electronically Signed   By: Ulyses Southward M.D.   On: 12/04/2017 09:17   Mr Maxine Glenn Head/brain UE Cm  Result Date: 12/04/2017 CLINICAL DATA:  Recurrent strokes. Worsening left leg weakness beginning yesterday. EXAM: MRI HEAD WITHOUT CONTRAST MRA HEAD WITHOUT CONTRAST TECHNIQUE: Multiplanar, multiecho pulse sequences of the brain and surrounding structures were obtained without intravenous contrast. Angiographic images of the head were obtained using MRA technique without contrast. COMPARISON:  CT 12/03/2017 FINDINGS: MRI HEAD FINDINGS Brain: Acute small vessel infarction in the right posterior basal ganglia and radiating white matter tracts. No other acute finding. Chronic small-vessel ischemic changes affect the pons. Old small vessel cerebellar infarctions. Old bilateral occipital cortical and subcortical infarctions. Old cortical and subcortical infarctions in the right parietal lobe. Chronic small-vessel ischemic changes of the thalami, basal ganglia and hemispheric white matter. No mass lesion, hemorrhage, hydrocephalus or extra-axial collection. Vascular: Major vessels at the base of the brain show flow. Skull and upper cervical spine: Negative Sinuses/Orbits: Clear/normal Other: None MRA HEAD FINDINGS Both internal carotid arteries are widely patent through the skull base and siphon regions. The anterior and middle cerebral vessels are patent without proximal stenosis, aneurysm or vascular malformation. Distal vessels show atherosclerotic irregularity. Both vertebral arteries are patent with the left being dominant. Mild atherosclerotic irregularity of the V4 segments without stenosis. The basilar artery is widely patent. Superior cerebellar and posterior cerebral arteries are patent. Distal vessel  atherosclerotic irregularity. IMPRESSION: Acute small vessel infarction in the posterior right basal ganglia and radiating white matter tracts. Old cortical infarctions affecting both occipital lobes in the right parietal region. Widespread chronic small-vessel ischemic changes throughout  the brain. MRA does not show a large or medium vessel occlusion or correctable proximal stenosis. Distal vessel atherosclerotic irregularity diffusely. Electronically Signed   By: Paulina Fusi M.D.   On: 12/04/2017 11:46    Medications:  I have reviewed the patient's current medications. Scheduled: . aspirin EC  81 mg Oral Daily  . clopidogrel  75 mg Oral QPM  . enoxaparin (LOVENOX) injection  40 mg Subcutaneous Q24H  . escitalopram  10 mg Oral Daily  . insulin aspart  0-5 Units Subcutaneous QHS  . insulin aspart  0-9 Units Subcutaneous TID WC  . insulin glargine  34 Units Subcutaneous QPM  . isosorbide mononitrate  30 mg Oral QPM  . pantoprazole  40 mg Oral BID AC  . cyanocobalamin  100 mcg Oral Daily    Assessment/Plan: No new neurological complaints.  Discussion had with son concerning wish for intervention beyond medical therapy for carotid stenosis.  He does not wish intervention beyond medical therapy at this time.  Will continue ASA and Plavix.     LOS: 2 days   Thana Farr, MD Neurology [phone] 12/05/2017  11:36 AM

## 2017-12-05 NOTE — Evaluation (Addendum)
Clinical/Bedside Swallow Evaluation Patient Details  Name: Nolyn Swab MRN: [id number] Date of Birth: 1924/03/22  Today's Date: 12/05/2017 Time: SLP Start Time (ACUTE ONLY): 1220 SLP Stop Time (ACUTE ONLY): 1320 SLP Time Calculation (min) (ACUTE ONLY): 60 min  Past Medical History:  Past Medical History:  Diagnosis Date  . Acid reflux   . Depression   . Diabetes mellitus without complication (HCC)   . Hypercholesteremia   . Hypertension   . MI (myocardial infarction) (HCC)   . Stroke [facility])    Past Surgical History:  Past Surgical History:  Procedure Laterality Date  . AORTIC VALVE REPLACEMENT (AVR)/CORONARY ARTERY BYPASS GRAFTING (CABG)     HPI:  Pt is a 84 y.o. male with a history of GERD(on PPI), depression, diabetes, hyperlipidemia CVA, MI and hypertension who presents to the ED for repeat falls.  Patient is fallen multiple times during the night, weakness was noted in the left leg this morning; pt also endorses mild Left UE weakness. Upon further chart review in pt's PCP notes, pt has had a h/o of falls and memory deficits (Cognitive decline) since Fall of 2018. Pt and Son also endorsed today that pt often puts "multiple bites of food in his mouth at one time" and has some coughing at home d/t this. Pt has a baseline of GERD and intermittent s/s of Reflux per pt report incuding a dry, hacking cough post meals intermittently; pt was unsure if on a PPI. Pt verbalized at conversational level telling 2 stories of his past w/ full intelligibility(gravely vocal quality which is baseline per pt) - pt is [facility] w/ aids. Pt is able to make wants/needs known. Slight decreased ROM/tone noted in Left labial corner during speech; slight decreased articulation noted in connected speech during conversation. Conversation is appropriate; 100% intelligible. Tends to talk intermittently, even w/ food held orally. Gentle cues required.    Assessment / Plan / Recommendation Clinical Impression  Pt  appears to present w/ adequate oropharyngeal phase swallow function w/ reduced risk for aspiration when following general aspiration precautions, Reflux precautions. Pt does have a baseline of GERD and Cognitive decline(appears mild) which can both impact swallowing function awareness/motility; any Reflux can increase risk for Reflux aspiration. Per Son and pt report, pt tends to put multiple bites of food in his mouth at one time to chew. He exhibited mild coughing (w/ multiple bites of food in his mouth) when experiencing poor coordination of bolus management during OT session last PM. W/ education and verbal cues during this session/meal w/ SLP, and when putting only one bite of food in his mouth at a time, then chewing and swallowing/clearing, pt demonstrated no overt oropharyngeal phase deficits. Pt consumed po trials of thin liquids(mostly via straw) and purees w/ soft solids and meats cut small w/ no overt s/s of aspiration noted; clear vocal quality noted post trials w/ no decline in respiratory status occurring. Oral phase appeared grossly [facility] for bolus management and clearing w/ all trials when following precautions and the education given on one bite of food in the mouth at a time; clear fully b/f taking another bite/sip. Pt was also encouraged to alternate bites of food w/ a small sip of liquid. Pt fed self w/ min setup assist. No OM weakness noted during lingual/labial movements to impact adequate bolus management. Pt wiped mouth to clear slight bolus reside in Left corner of mouth x2; stated he wiped his mouth "at home to clean it" because of food left on his lips(?).  Assured him this was a good strategy if lacking min sensation/awareness. Recommend continue the Mech Soft style(cut meats, moist cooked foods) diet w/ thin liquids; the meats cut small for easier mastication and clearing. Recommend general aspiration precautions; Pills in Puree for easier swallowing if needed. Recommend verbal cues to  follow precautions/stategies as needed. Recommend continue f/u w/ Reflux precautions/management.  SLP Visit Diagnosis: Dysphagia, unspecified (R13.10)(Cognitive decline)    Aspiration Risk  (reduced following general precautions)    Diet Recommendation  Mech Soft diet (meats cut, moistened) w/ Thin liquids. General aspiration and Reflux precautions. Reduce distractions during meals; verbal cues to only put one bite of food in mouth at a time - clear fully b/t bites.  Medication Administration: Whole meds with puree(if easier for swallowing/clearing)    Other  Recommendations Recommended Consults: (f/u w/ Neurology for education on Cognitive decline, impact) Oral Care Recommendations: Oral care BID;Patient independent with oral care(may need cues) Other Recommendations: (n/a)   Follow up Recommendations None      Frequency and Duration (n/a)  (n/a)       Prognosis Prognosis for Safe Diet Advancement: Good Barriers to Reach Goals: Cognitive deficits(Reflux/GERD)      Swallow Study   General Date of Onset: 12/03/17 HPI: Pt is a 84 y.o. male with a history of GERD(on PPI), depression, diabetes, hyperlipidemia CVA, MI and hypertension who presents to the ED for repeat falls.  Patient is fallen multiple times during the night, weakness was noted in the left leg this morning; pt also endorses mild Left UE weakness. Upon further chart review in pt's PCP notes, pt has had a h/o of falls and memory deficits (Cognitive decline) since Fall of 2018. Pt and Son also endorsed today that pt often puts "multiple bites of food in his mouth at one time" and has some coughing at home d/t this. Pt has a baseline of GERD and intermittent s/s of Reflux per pt report incuding a dry, hacking cough post meals intermittently; pt was unsure if on a PPI. Pt verbalized at conversational level telling 2 stories of his past w/ full intelligibility(gravely vocal quality which is baseline per pt) - pt is [facility] w/ aids. Pt  is able to make wants/needs known. Slight decreased ROM/tone noted in Left labial corner during speech; slight decreased articulation noted in connected speech during conversation.  Type of Study: Bedside Swallow Evaluation Previous Swallow Assessment: none Diet Prior to this Study: Regular;Thin liquids Temperature Spikes Noted: No(wbc not elevated, 7.7) Respiratory Status: Room air History of Recent Intubation: No Behavior/Cognition: Alert;Cooperative;Pleasant mood;Distractible;Requires cueing(deficits at baseline) Oral Cavity Assessment: Within Functional Limits Oral Care Completed by SLP: Recent completion by staff Oral Cavity - Dentition: Adequate natural dentition Vision: Functional for self-feeding Self-Feeding Abilities: Able to feed self;Needs set up(reduced initiation w/ tray prep by pt) Patient Positioning: Upright in chair Baseline Vocal Quality: Normal(gravely) Volitional Cough: Strong Volitional Swallow: Able to elicit    Oral/Motor/Sensory Function Overall Oral Motor/Sensory Function: (slight Left labial corner decreased tone/sensation)   Ice Chips Ice chips: Not tested   Thin Liquid Thin Liquid: Within functional limits Presentation: Cup;Self Fed;Straw(~6 ozs total during meal)    Nectar Thick Nectar Thick Liquid: Not tested   Honey Thick Honey Thick Liquid: Not tested   Puree Puree: Within functional limits Presentation: Self Fed;Spoon(3 trials) Other Comments: pt utilized lingual sweeping to clear mouth appropriately   Solid   GO   Solid: Within functional limits Presentation: Self Fed;Spoon(10+ boluses - one at a time) Other  Comments: pt tends to put multiple bites of food in his mouth at one time and exhibited mild coughing when experiencing poor coordination of bolus management during OT session.  W/ education and cues and when putting only one bite of food in his mouth at a time, chewing, and swallowing/clearing, no overt oropharyngeal phase deficits were noted.          Jerilynn SomKatherine Ovidio Steele, MS, CCC-SLP Sujey Gundry 12/05/2017,4:11 PM

## 2017-12-05 NOTE — Progress Notes (Signed)
[facility] - [place] [facility]   PATIENT NAME: Shannon Ayala    MR#:  [id number]  DATE OF BIRTH:  [birth year]  SUBJECTIVE:   Patient is awake and sitting in bed.  Son at bedside.  REVIEW OF SYSTEMS:   Review of Systems  Constitutional: Positive for malaise/fatigue. Negative for chills and fever.  HENT: Negative for sore throat.   Eyes: Negative for blurred vision, double vision and pain.  Respiratory: Negative for cough, hemoptysis, shortness of breath and wheezing.   Cardiovascular: Negative for chest pain, palpitations, orthopnea and leg swelling.  Gastrointestinal: Negative for abdominal pain, constipation, diarrhea, heartburn, nausea and vomiting.  Genitourinary: Negative for dysuria and hematuria.  Musculoskeletal: Negative for back pain and joint pain.  Skin: Negative for rash.  Neurological: Positive for focal weakness. Negative for sensory change, speech change and headaches.  Endo/Heme/Allergies: Does not bruise/bleed easily.  Psychiatric/Behavioral: Negative for depression. The patient is not nervous/anxious.    Tolerating Diet: Tolerating PT:   DRUG ALLERGIES:   Allergies  Allergen Reactions  . Codeine     Reaction unknown  . Darvon [Propoxyphene]     Darvocet (Proproxyphene with Acetaminophen)  . Morphine     Reaction unknown    VITALS:  Blood pressure (!) 142/73, pulse 73, temperature (!) 97.5 F (36.4 C), temperature source Oral, resp. rate 20, height 5\' 6"  (1.676 m), weight 83.2 kg (183 lb 6.4 oz), SpO2 96 %.  PHYSICAL EXAMINATION:   Physical Exam  GENERAL:  84 y.o.-year-old patient lying in the bed with no acute distress.  EYES: Pupils equal, round, reactive to light and accommodation. No scleral icterus. Extraocular muscles intact.  HEENT: Head atraumatic, normocephalic. Oropharynx and nasopharynx clear.  NECK:  Supple, no jugular venous distention. No thyroid enlargement, no tenderness.  LUNGS: Normal breath  sounds bilaterally, no wheezing, rales, rhonchi. No use of accessory muscles of respiration.  CARDIOVASCULAR: S1, S2 normal. No murmurs, rubs, or gallops.  ABDOMEN: Soft, nontender, nondistended. Bowel sounds present. No organomegaly or mass.  EXTREMITIES: No cyanosis, clubbing or edema b/l.    NEUROLOGIC: Cranial nerves II through XII are intact.  Decreased motor strength on the left PSYCHIATRIC:  patient is alert and awake SKIN: No obvious rash, lesion, or ulcer.   LABORATORY PANEL:  CBC Recent Labs  Lab 12/03/17 1036  WBC 7.7  HGB 13.6  HCT 41.1  PLT 215    Chemistries  Recent Labs  Lab 12/03/17 1036  NA 135  K 4.2  CL 103  CO2 28  GLUCOSE 351*  BUN 18  CREATININE 0.79  CALCIUM 9.3  AST 20  ALT 10*  ALKPHOS 97  BILITOT 0.9   Cardiac Enzymes Recent Labs  Lab 12/03/17 1036  TROPONINI <0.03   RADIOLOGY:  Dg Eye Foreign Body  Result Date: 12/04/2017 CLINICAL DATA:  Metal working/exposure; clearance prior to MRI EXAM: ORBITS FOR FOREIGN BODY - 2 VIEW COMPARISON:  None. FINDINGS: There is no evidence of metallic foreign body within the orbits. No significant bone abnormality identified. Bilateral hearing aids noted. IMPRESSION: No evidence of metallic foreign body within the orbits. Bilateral hearing aids noted. Electronically Signed   By: Charlett NoseKevin  Dover M.D.   On: 12/04/2017 09:12   Dg Neck Soft Tissue  Result Date: 12/04/2017 CLINICAL DATA:  Evaluate for possible retained metal fragments EXAM: NECK SOFT TISSUES - 1+ VIEW COMPARISON:  None. FINDINGS: Degenerative changes of the cervical spine are noted. Postoperative clips are noted within the right neck. Bilateral  hearing aids are again noted. Postsurgical changes in the sternum are noted. No other focal abnormality is noted. IMPRESSION: Postsurgical changes as described. Electronically Signed   By: Alcide Clever M.D.   On: 12/04/2017 09:21   Dg Abd 1 View  Result Date: 12/04/2017 CLINICAL DATA:  Evaluate for retained  metal for MRI clearance EXAM: ABDOMEN - 1 VIEW COMPARISON:  None. FINDINGS: Scattered large and small bowel gas is noted. No abnormal mass or abnormal calcifications are seen. Degenerative changes of the lumbar spine are noted. No retained metallic densities are identified. IMPRESSION: No definitive retained metal foreign body Electronically Signed   By: Alcide Clever M.D.   On: 12/04/2017 09:21   Mr Brain Wo Contrast  Result Date: 12/04/2017 CLINICAL DATA:  Recurrent strokes. Worsening left leg weakness beginning yesterday. EXAM: MRI HEAD WITHOUT CONTRAST MRA HEAD WITHOUT CONTRAST TECHNIQUE: Multiplanar, multiecho pulse sequences of the brain and surrounding structures were obtained without intravenous contrast. Angiographic images of the head were obtained using MRA technique without contrast. COMPARISON:  CT 12/03/2017 FINDINGS: MRI HEAD FINDINGS Brain: Acute small vessel infarction in the right posterior basal ganglia and radiating white matter tracts. No other acute finding. Chronic small-vessel ischemic changes affect the pons. Old small vessel cerebellar infarctions. Old bilateral occipital cortical and subcortical infarctions. Old cortical and subcortical infarctions in the right parietal lobe. Chronic small-vessel ischemic changes of the thalami, basal ganglia and hemispheric white matter. No mass lesion, hemorrhage, hydrocephalus or extra-axial collection. Vascular: Major vessels at the base of the brain show flow. Skull and upper cervical spine: Negative Sinuses/Orbits: Clear/normal Other: None MRA HEAD FINDINGS Both internal carotid arteries are widely patent through the skull base and siphon regions. The anterior and middle cerebral vessels are patent without proximal stenosis, aneurysm or vascular malformation. Distal vessels show atherosclerotic irregularity. Both vertebral arteries are patent with the left being dominant. Mild atherosclerotic irregularity of the V4 segments without stenosis. The  basilar artery is widely patent. Superior cerebellar and posterior cerebral arteries are patent. Distal vessel atherosclerotic irregularity. IMPRESSION: Acute small vessel infarction in the posterior right basal ganglia and radiating white matter tracts. Old cortical infarctions affecting both occipital lobes in the right parietal region. Widespread chronic small-vessel ischemic changes throughout the brain. MRA does not show a large or medium vessel occlusion or correctable proximal stenosis. Distal vessel atherosclerotic irregularity diffusely. Electronically Signed   By: Paulina Fusi M.D.   On: 12/04/2017 11:46   US Carotid Bilateral (at [facility] And [facility] Only)  Result Date: 12/04/2017 CLINICAL DATA:  84 year old male with a history of cerebrovascular accident. Cardiovascular risk factors include stroke/TIA, hypertension, coronary artery disease, hyperlipidemia, diabetes, smoking Given history of prior left endarterectomy EXAM: BILATERAL CAROTID DUPLEX ULTRASOUND TECHNIQUE: Wallace Cullens scale imaging, color Doppler and duplex ultrasound were performed of bilateral carotid and vertebral arteries in the neck. COMPARISON:  No prior duplex FINDINGS: Criteria: Quantification of carotid stenosis is based on velocity parameters that correlate the residual internal carotid diameter with NASCET-based stenosis levels, using the diameter of the distal internal carotid lumen as the denominator for stenosis measurement. The following velocity measurements were obtained: RIGHT ICA:  Systolic 113 cm/sec, Diastolic 20 cm/sec CCA:  92 cm/sec SYSTOLIC ICA/CCA RATIO:  1.2 ECA:  88 cm/sec LEFT ICA:  Systolic 102 cm/sec, Diastolic 24 cm/sec CCA:  97 cm/sec SYSTOLIC ICA/CCA RATIO:  1.0 ECA: Occluded Right Brachial SBP: Not acquired Left Brachial SBP: Not acquired RIGHT CAROTID ARTERY: Significant calcifications of the right common carotid artery. Intermediate waveform  maintained. Moderate heterogeneous and partially calcified plaque at the  right carotid bifurcation. Lumen shadowing present. Low resistance waveform of the right ICA. Mild tortuosity RIGHT VERTEBRAL ARTERY: Antegrade flow with low resistance waveform. LEFT CAROTID ARTERY: No significant calcifications of the left common carotid artery. Intermediate waveform maintained. Moderate heterogeneous and partially calcified plaque at the left carotid bifurcation. No significant lumen shadowing. Low resistance waveform of the left ICA. No significant tortuosity. LEFT VERTEBRAL ARTERY:  Antegrade flow with low resistance waveform. IMPRESSION: Right: Heterogeneous and calcified plaque at the right carotid bifurcation, as well as within the common carotid artery, with no hemodynamically significant stenosis by established duplex criteria. Note that the flow velocities of the right ICA were obtained from an area distal to the maximum narrowing due to the presence of anterior wall plaque with shadowing and may be underestimating the percentage of ICA stenosis. If a more precise assessment is required, consider a formal cerebral angiogram or alternatively CT angiogram Left: There is a history of left carotid endarterectomy, and note that established duplex criteria have not been validated in the setting of postsurgical changes. However, the duplex study shows no evidence of significant restenosis or plaque. Signed, Yvone Neu. Loreta Ave, DO [facility] Electronically Signed   By: Gilmer Mor D.O.   On: 12/04/2017 10:55   Dg Chest Port 1 View  Result Date: 12/04/2017 CLINICAL DATA:  MRI clearance EXAM: PORTABLE CHEST 1 VIEW COMPARISON:  06/29/2009 FINDINGS: Enlargement of cardiac silhouette post median sternotomy. Multiple sternal wires present. Atherosclerotic calcification aorta. Mediastinal contours and pulmonary vascularity normal. Minimal bronchitic changes without pulmonary infiltrate, pleural effusion or pneumothorax. Deformity of multiple  lateral LEFT ribs secondary to old fractures. Few gown snaps project over the shoulders and LEFT supraclavicular region. EKG lead lower RIGHT chest. No other metallic foreign bodies. IMPRESSION: Enlargement of cardiac silhouette post median sternotomy. No acute abnormalities. Electronically Signed   By: Ulyses Southward M.D.   On: 12/04/2017 09:17   Mr Maxine Glenn Head/brain WU Cm  Result Date: 12/04/2017 CLINICAL DATA:  Recurrent strokes. Worsening left leg weakness beginning yesterday. EXAM: MRI HEAD WITHOUT CONTRAST MRA HEAD WITHOUT CONTRAST TECHNIQUE: Multiplanar, multiecho pulse sequences of the brain and surrounding structures were obtained without intravenous contrast. Angiographic images of the head were obtained using MRA technique without contrast. COMPARISON:  CT 12/03/2017 FINDINGS: MRI HEAD FINDINGS Brain: Acute small vessel infarction in the right posterior basal ganglia and radiating white matter tracts. No other acute finding. Chronic small-vessel ischemic changes affect the pons. Old small vessel cerebellar infarctions. Old bilateral occipital cortical and subcortical infarctions. Old cortical and subcortical infarctions in the right parietal lobe. Chronic small-vessel ischemic changes of the thalami, basal ganglia and hemispheric white matter. No mass lesion, hemorrhage, hydrocephalus or extra-axial collection. Vascular: Major vessels at the base of the brain show flow. Skull and upper cervical spine: Negative Sinuses/Orbits: Clear/normal Other: None MRA HEAD FINDINGS Both internal carotid arteries are widely patent through the skull base and siphon regions. The anterior and middle cerebral vessels are patent without proximal stenosis, aneurysm or vascular malformation. Distal vessels show atherosclerotic irregularity. Both vertebral arteries are patent with the left being dominant. Mild atherosclerotic irregularity of the V4 segments without stenosis. The basilar artery is widely patent. Superior  cerebellar and posterior cerebral arteries are patent. Distal vessel atherosclerotic irregularity. IMPRESSION: Acute small vessel infarction in the posterior right basal ganglia and radiating white matter tracts. Old cortical infarctions affecting both occipital lobes in the right  parietal region. Widespread chronic small-vessel ischemic changes throughout the brain. MRA does not show a large or medium vessel occlusion or correctable proximal stenosis. Distal vessel atherosclerotic irregularity diffusely. Electronically Signed   By: Paulina Fusi M.D.   On: 12/04/2017 11:46   ASSESSMENT AND PLAN:  Shannon Ayala  is a 84 y.o. male with a known history of recurrent strokes, deafness metas, hypertension hyperlipidemia, history of MI, GERD is presenting to the ED with a chief complaint of worsening of left leg weakness.  CT head has revealed acute to subacute stroke  # Acute to subacute stroke with history of recurrent strokes in the past  CT head has revealed acute to subacute stroke in the right basal ganglia MRI/MRA of the brain carotid Dopplers noted.  2D echocardiogram pending results PT, OT evaluation done Neurology consult appreciated. Continue aspirin and Plavix on high intensity statin Discussed with son.  Patient lives alone during the daytime.  Has significant weakness on the left side.  Will need skilled nursing facility at discharge.  # Essential hypertension Medications held for permissive hypertension  # Insulin dependent diabetes mellitus On 30 units Lantus.  We will increase to 34 units today.  # Hyperlipidemia - continue statin  # History of coronary artery disease status post MI Continue aspirin Plavix statin  #Tobacco chewing disorder Counseled to quit earlier in admission  Case discussed with Care Management/Social Worker.  CODE STATUS: full DVT Prophylaxis: Lovenox  TOTAL TIME TAKING CARE OF THIS PATIENT: 30 minutes.   POSSIBLE D/C IN 1-2 DAYS, DEPENDING  ON CLINICAL CONDITION.  Note: This dictation was prepared with Dragon dictation along with smaller phrase technology. Any transcriptional errors that result from this process are unintentional.  Orie Fisherman M.D on 12/05/2017 at 10:56 AM  Between 7am to 6pm - Pager - [phone]  After 6pm go to [email or web address] - password [email or web address]  [facility]  Office  [phone]  CC: Primary care physician; System, Pcp Not InPatient ID: Shannon Ayala, male   DOB: March 07, 1924, 84 y.o.   MRN: [id number]

## 2017-12-05 NOTE — Clinical Social Work Note (Signed)
Clinical Social Work Assessment  Patient Details  Name: Shannon Ayala MRN: [id number] Date of Birth: 04/27/1924  Date of referral:  12/05/17               Reason for consult:  Facility Placement                Permission sought to share information with:  Chartered certified accountant granted to share information::  Yes, Verbal Permission Granted  Name::      [name]::   [name]   Relationship::     Contact Information:     Housing/Transportation Living arrangements for the past 2 months:  [place] of Information:  Patient, Adult Children Patient Interpreter Needed:  None Criminal Activity/Legal Involvement Pertinent to Current Situation/Hospitalization:  No - Comment as needed Significant Relationships:  Adult Children Lives with:  Adult Children Do you feel safe going back to the place where you live?  Yes Need for family participation in patient care:  Yes (Comment)  Care giving concerns:  Patient lives in [place] with his son Shannon Ayala.    Social Worker assessment / plan:  Holiday representative (CSW) received SNF consult. PT is recommending SNF. CSW met with patient to discuss D/C plan. Patient was alert and oriented X4 and was laying in the bed. CSW introduced self and explained role of CSW department. Patient reported that he lives in [place] with her son Shannon Ayala. CSW explained that medicare requires a 3 night qualifying inpatient stay in a hospital in order to pay for SNF. Patient was admitted to inpatient on 12/03/17. Patient is agreeable to SNF search in [facility]. FL2 complete and faxed out.   CSW presented bed offers to patient and he chose [facility]. [facility] admissions coordinator at [facility] is aware of above. CSW contacted patient's son Shannon Ayala and made him aware of above. CSW will continue to follow and assist as needed.   Employment status:  Disabled (Comment on whether or not  currently receiving Disability) Insurance information:  Medicare PT Recommendations:  [place] / Referral to community resources:  [place]  Patient/Family's Response to care:  Patient is agreeable to D/C to [facility].   Patient/Family's Understanding of and Emotional Response to Diagnosis, Current Treatment, and Prognosis:  Patient was very pleasant and thanked CSW for assistance.   Emotional Assessment Appearance:  Appears stated age Attitude/Demeanor/Rapport:    Affect (typically observed):  Accepting, Adaptable, Pleasant Orientation:  Oriented to Self, Oriented to Place, Oriented to  Time, Oriented to Situation Alcohol / Substance use:  Not Applicable Psych involvement (Current and /or in the community):  No (Comment)  Discharge Needs  Concerns to be addressed:  Discharge Planning Concerns Readmission within the last 30 days:  No Current discharge risk:  Dependent with Mobility Barriers to Discharge:  Continued Medical Work up   [employer], Veronia Beets, LCSW 12/05/2017, 4:15 PM

## 2017-12-06 LAB — GLUCOSE, CAPILLARY
GLUCOSE-CAPILLARY: 197 mg/dL — AB (ref 65–99)
Glucose-Capillary: 178 mg/dL — ABNORMAL HIGH (ref 65–99)

## 2017-12-06 MED ORDER — INSULIN GLARGINE 100 UNIT/ML ~~LOC~~ SOLN: 42.0000 [IU] | Freq: Every day | SUBCUTANEOUS | 11 refills | Status: AC

## 2017-12-06 NOTE — Clinical Social Work Note (Signed)
The patient will discharge to [facility] today via non-emergent EMS. The patient's son and the facility are aware and in agreement. The CSW has delivered the discharge packet to the chart and updated the RN. CSW is signing off. Please consult should needs arise.  Argentina PonderKaren Martha Jeraline Marcinek, MSW, Theresia MajorsLCSWA [phone]

## 2017-12-06 NOTE — Discharge Summary (Signed)
[facility] [place] [facility]   PATIENT NAME: Shannon Ayala    MR#:  [id number]  DATE OF BIRTH:  1924/04/16  DATE OF ADMISSION:  12/03/2017 ADMITTING PHYSICIAN: Ramonita Lab, MD  DATE OF DISCHARGE: 12/06/2017  PRIMARY CARE PHYSICIAN: System, Pcp Not In   ADMISSION DIAGNOSIS:  Cerebrovascular accident (CVA), unspecified mechanism (HCC) [I63.9]  DISCHARGE DIAGNOSIS:  Active Problems:   Acute CVA (cerebrovascular accident) (HCC)   SECONDARY DIAGNOSIS:   Past Medical History:  Diagnosis Date  . Acid reflux   . Depression   . Diabetes mellitus without complication (HCC)   . Hypercholesteremia   . Hypertension   . MI (myocardial infarction) (HCC)   . Stroke [facility])      ADMITTING HISTORY  HISTORY OF PRESENT ILLNESS:  Shannon Ayala  is a 84 y.o. male with a known history of recurrent strokes, deafness metas, hypertension hyperlipidemia, history of MI, GERD is presenting to the ED with a chief complaint of worsening of left leg weakness.  CT head has revealed acute to subacute stroke.  Patient was evaluated by neurology Dr. Thad Ranger in the emergency department who has recommended to admit the patient for complete stroke workup.  Patient has passed bedside swallow evaluation.  Patient denies any headache or blurry vision.  Denies any chest pain or shortness of breath.  No family members at bedside.     HOSPITAL COURSE:   # Acute to subacute stroke with history of recurrent strokes in the past CT head has revealed acute to subacute stroke in the right basal ganglia MRI/MRA of the brain carotid Dopplers noted.  2D echocardiogram pending results PT, OT evaluation done Neurology consult appreciated. Continue aspirin and Plavix on high intensity statin Discussed with son.  Patient lives alone during the daytime.  Has significant weakness on the left side.  Will need skilled nursing facility at discharge.  # Essential hypertension D/C lisinopril and  HCTZ. continue imdur  # Insulin dependent diabetes mellitus ON lantus and SSI  # Hyperlipidemia - continue statin  # History of coronary artery disease status post MI Continue aspirin Plavix statin  #Tobacco chewing disorder Counseled to quit earlier in admission  Stable for discharge to SNF    CONSULTS OBTAINED:  Treatment Team:  Kym Groom, MD Thana Farr, MD  DRUG ALLERGIES:   Allergies  Allergen Reactions  . Codeine     Reaction unknown  . Darvon [Propoxyphene]     Darvocet (Proproxyphene with Acetaminophen)  . Morphine     Reaction unknown    DISCHARGE MEDICATIONS:   Allergies as of 12/06/2017      Reactions   Codeine    Reaction unknown   Darvon [propoxyphene]    Darvocet (Proproxyphene with Acetaminophen)   Morphine    Reaction unknown      Medication List    STOP taking these medications   hydrochlorothiazide 12.5 MG capsule Commonly known as:  MICROZIDE   lisinopril 2.5 MG tablet Commonly known as:  PRINIVIL,ZESTRIL     TAKE these medications   aspirin EC 81 MG tablet Take 81 mg by mouth daily.   clopidogrel 75 MG tablet Commonly known as:  PLAVIX Take 75 mg by mouth every evening.   escitalopram 20 MG tablet Commonly known as:  LEXAPRO Take 10 mg by mouth daily.   insulin glargine 100 UNIT/ML injection Commonly known as:  LANTUS Inject 0.42 mLs (42 Units total) into the skin at bedtime. What changed:    how much to  take  when to take this   isosorbide mononitrate 30 MG 24 hr tablet Commonly known as:  IMDUR Take 30 mg by mouth every evening.   pantoprazole 40 MG tablet Commonly known as:  PROTONIX Take 40 mg by mouth 2 (two) times daily.   propranolol 10 MG tablet Commonly known as:  INDERAL Take 10 mg by mouth 3 (three) times daily.   simvastatin 20 MG tablet Commonly known as:  ZOCOR Take 20 mg by mouth every evening.   TH VITAMIN B12 100 MCG tablet Generic drug:  cyanocobalamin Take 100 mcg by  mouth daily.       Today   VITAL SIGNS:  Blood pressure 121/66, pulse 70, temperature 98.6 F (37 C), resp. rate 18, height 5\' 6"  (1.676 m), weight 83.2 kg (183 lb 6.4 oz), SpO2 94 %.  I/O:    Intake/Output Summary (Last 24 hours) at 12/06/2017 0852 Last data filed at 12/05/2017 1049 Gross per 24 hour  Intake 240 ml  Output -  Net 240 ml    PHYSICAL EXAMINATION:  Physical Exam  GENERAL:  84 y.o.-year-old patient lying in the bed with no acute distress.  LUNGS: Normal breath sounds bilaterally, no wheezing, rales,rhonchi or crepitation. No use of accessory muscles of respiration.  CARDIOVASCULAR: S1, S2 normal. No murmurs, rubs, or gallops.  ABDOMEN: Soft, non-tender, non-distended. Bowel sounds present. No organomegaly or mass.  NEUROLOGIC: Moves all 4 extremities. Motor left 4/5 PSYCHIATRIC: The patient is alert and oriented x 3.  SKIN: No obvious rash, lesion, or ulcer.   DATA REVIEW:   CBC Recent Labs  Lab 12/03/17 1036  WBC 7.7  HGB 13.6  HCT 41.1  PLT 215    Chemistries  Recent Labs  Lab 12/03/17 1036  NA 135  K 4.2  CL 103  CO2 28  GLUCOSE 351*  BUN 18  CREATININE 0.79  CALCIUM 9.3  AST 20  ALT 10*  ALKPHOS 97  BILITOT 0.9    Cardiac Enzymes Recent Labs  Lab 12/03/17 1036  TROPONINI <0.03    Microbiology Results  No results found for this or any previous visit.  RADIOLOGY:  Dg Eye Foreign Body  Result Date: 12/04/2017 CLINICAL DATA:  Metal working/exposure; clearance prior to MRI EXAM: ORBITS FOR FOREIGN BODY - 2 VIEW COMPARISON:  None. FINDINGS: There is no evidence of metallic foreign body within the orbits. No significant bone abnormality identified. Bilateral hearing aids noted. IMPRESSION: No evidence of metallic foreign body within the orbits. Bilateral hearing aids noted. Electronically Signed   By: Charlett Nose M.D.   On: 12/04/2017 09:12   Dg Neck Soft Tissue  Result Date: 12/04/2017 CLINICAL DATA:  Evaluate for possible  retained metal fragments EXAM: NECK SOFT TISSUES - 1+ VIEW COMPARISON:  None. FINDINGS: Degenerative changes of the cervical spine are noted. Postoperative clips are noted within the right neck. Bilateral hearing aids are again noted. Postsurgical changes in the sternum are noted. No other focal abnormality is noted. IMPRESSION: Postsurgical changes as described. Electronically Signed   By: Alcide Clever M.D.   On: 12/04/2017 09:21   Dg Abd 1 View  Result Date: 12/04/2017 CLINICAL DATA:  Evaluate for retained metal for MRI clearance EXAM: ABDOMEN - 1 VIEW COMPARISON:  None. FINDINGS: Scattered large and small bowel gas is noted. No abnormal mass or abnormal calcifications are seen. Degenerative changes of the lumbar spine are noted. No retained metallic densities are identified. IMPRESSION: No definitive retained metal foreign body Electronically Signed  By: Alcide CleverMark  Lukens M.D.   On: 12/04/2017 09:21   Mr Brain Wo Contrast  Result Date: 12/04/2017 CLINICAL DATA:  Recurrent strokes. Worsening left leg weakness beginning yesterday. EXAM: MRI HEAD WITHOUT CONTRAST MRA HEAD WITHOUT CONTRAST TECHNIQUE: Multiplanar, multiecho pulse sequences of the brain and surrounding structures were obtained without intravenous contrast. Angiographic images of the head were obtained using MRA technique without contrast. COMPARISON:  CT 12/03/2017 FINDINGS: MRI HEAD FINDINGS Brain: Acute small vessel infarction in the right posterior basal ganglia and radiating white matter tracts. No other acute finding. Chronic small-vessel ischemic changes affect the pons. Old small vessel cerebellar infarctions. Old bilateral occipital cortical and subcortical infarctions. Old cortical and subcortical infarctions in the right parietal lobe. Chronic small-vessel ischemic changes of the thalami, basal ganglia and hemispheric white matter. No mass lesion, hemorrhage, hydrocephalus or extra-axial collection. Vascular: Major vessels at the base of  the brain show flow. Skull and upper cervical spine: Negative Sinuses/Orbits: Clear/normal Other: None MRA HEAD FINDINGS Both internal carotid arteries are widely patent through the skull base and siphon regions. The anterior and middle cerebral vessels are patent without proximal stenosis, aneurysm or vascular malformation. Distal vessels show atherosclerotic irregularity. Both vertebral arteries are patent with the left being dominant. Mild atherosclerotic irregularity of the V4 segments without stenosis. The basilar artery is widely patent. Superior cerebellar and posterior cerebral arteries are patent. Distal vessel atherosclerotic irregularity. IMPRESSION: Acute small vessel infarction in the posterior right basal ganglia and radiating white matter tracts. Old cortical infarctions affecting both occipital lobes in the right parietal region. Widespread chronic small-vessel ischemic changes throughout the brain. MRA does not show a large or medium vessel occlusion or correctable proximal stenosis. Distal vessel atherosclerotic irregularity diffusely. Electronically Signed   By: Paulina FusiMark  Shogry M.D.   On: 12/04/2017 11:46   [place] Carotid Bilateral (at [facility] Only)  Result Date: 12/04/2017 CLINICAL DATA:  84 year old male with a history of cerebrovascular accident. Cardiovascular risk factors include stroke/TIA, hypertension, coronary artery disease, hyperlipidemia, diabetes, smoking Given history of prior left endarterectomy EXAM: BILATERAL CAROTID DUPLEX ULTRASOUND TECHNIQUE: Wallace CullensGray scale imaging, color Doppler and duplex ultrasound were performed of bilateral carotid and vertebral arteries in the neck. COMPARISON:  No prior duplex FINDINGS: Criteria: Quantification of carotid stenosis is based on velocity parameters that correlate the residual internal carotid diameter with NASCET-based stenosis levels, using the diameter of the distal internal carotid lumen as the denominator for stenosis measurement. The  following velocity measurements were obtained: RIGHT ICA:  Systolic 113 cm/sec, Diastolic 20 cm/sec CCA:  92 cm/sec SYSTOLIC ICA/CCA RATIO:  1.2 ECA:  88 cm/sec LEFT ICA:  Systolic 102 cm/sec, Diastolic 24 cm/sec CCA:  97 cm/sec SYSTOLIC ICA/CCA RATIO:  1.0 ECA: Occluded Right Brachial SBP: Not acquired Left Brachial SBP: Not acquired RIGHT CAROTID ARTERY: Significant calcifications of the right common carotid artery. Intermediate waveform maintained. Moderate heterogeneous and partially calcified plaque at the right carotid bifurcation. Lumen shadowing present. Low resistance waveform of the right ICA. Mild tortuosity RIGHT VERTEBRAL ARTERY: Antegrade flow with low resistance waveform. LEFT CAROTID ARTERY: No significant calcifications of the left common carotid artery. Intermediate waveform maintained. Moderate heterogeneous and partially calcified plaque at the left carotid bifurcation. No significant lumen shadowing. Low resistance waveform of the left ICA. No significant tortuosity. LEFT VERTEBRAL ARTERY:  Antegrade flow with low resistance waveform. IMPRESSION: Right: Heterogeneous and calcified plaque at the right carotid bifurcation, as well as within the common carotid artery, with no hemodynamically significant stenosis  by established duplex criteria. Note that the flow velocities of the right ICA were obtained from an area distal to the maximum narrowing due to the presence of anterior wall plaque with shadowing and may be underestimating the percentage of ICA stenosis. If a more precise assessment is required, consider a formal cerebral angiogram or alternatively CT angiogram Left: There is a history of left carotid endarterectomy, and note that established duplex criteria have not been validated in the setting of postsurgical changes. However, the duplex study shows no evidence of significant restenosis or plaque. Signed, Yvone Neu. Loreta Ave, DO [facility]  Radiology Electronically Signed   By: Gilmer Mor D.O.   On: 12/04/2017 10:55   Dg Chest Port 1 View  Result Date: 12/04/2017 CLINICAL DATA:  MRI clearance EXAM: PORTABLE CHEST 1 VIEW COMPARISON:  06/29/2009 FINDINGS: Enlargement of cardiac silhouette post median sternotomy. Multiple sternal wires present. Atherosclerotic calcification aorta. Mediastinal contours and pulmonary vascularity normal. Minimal bronchitic changes without pulmonary infiltrate, pleural effusion or pneumothorax. Deformity of multiple lateral LEFT ribs secondary to old fractures. Few gown snaps project over the shoulders and LEFT supraclavicular region. EKG lead lower RIGHT chest. No other metallic foreign bodies. IMPRESSION: Enlargement of cardiac silhouette post median sternotomy. No acute abnormalities. Electronically Signed   By: Ulyses Southward M.D.   On: 12/04/2017 09:17   Mr Maxine Glenn Head/brain ZO Cm  Result Date: 12/04/2017 CLINICAL DATA:  Recurrent strokes. Worsening left leg weakness beginning yesterday. EXAM: MRI HEAD WITHOUT CONTRAST MRA HEAD WITHOUT CONTRAST TECHNIQUE: Multiplanar, multiecho pulse sequences of the brain and surrounding structures were obtained without intravenous contrast. Angiographic images of the head were obtained using MRA technique without contrast. COMPARISON:  CT 12/03/2017 FINDINGS: MRI HEAD FINDINGS Brain: Acute small vessel infarction in the right posterior basal ganglia and radiating white matter tracts. No other acute finding. Chronic small-vessel ischemic changes affect the pons. Old small vessel cerebellar infarctions. Old bilateral occipital cortical and subcortical infarctions. Old cortical and subcortical infarctions in the right parietal lobe. Chronic small-vessel ischemic changes of the thalami, basal ganglia and hemispheric white matter. No mass lesion, hemorrhage, hydrocephalus or extra-axial collection. Vascular: Major vessels at the base of the brain show flow. Skull and upper cervical  spine: Negative Sinuses/Orbits: Clear/normal Other: None MRA HEAD FINDINGS Both internal carotid arteries are widely patent through the skull base and siphon regions. The anterior and middle cerebral vessels are patent without proximal stenosis, aneurysm or vascular malformation. Distal vessels show atherosclerotic irregularity. Both vertebral arteries are patent with the left being dominant. Mild atherosclerotic irregularity of the V4 segments without stenosis. The basilar artery is widely patent. Superior cerebellar and posterior cerebral arteries are patent. Distal vessel atherosclerotic irregularity. IMPRESSION: Acute small vessel infarction in the posterior right basal ganglia and radiating white matter tracts. Old cortical infarctions affecting both occipital lobes in the right parietal region. Widespread chronic small-vessel ischemic changes throughout the brain. MRA does not show a large or medium vessel occlusion or correctable proximal stenosis. Distal vessel atherosclerotic irregularity diffusely. Electronically Signed   By: Paulina Fusi M.D.   On: 12/04/2017 11:46    Follow up with PCP in 1 week.  Management plans discussed with the patient, family and they are in agreement.  CODE STATUS:     Code Status Orders  (From admission, onward)        Start     Ordered   12/03/17 1635  Full code  Continuous     12/03/17 1634  Code Status History    This patient has a current code status but no historical code status.      TOTAL TIME TAKING CARE OF THIS PATIENT ON DAY OF DISCHARGE: more than 30 minutes.   Molinda Bailiff Giah Fickett M.D on 12/06/2017 at 8:52 AM  Between 7am to 6pm - Pager - [phone]  After 6pm go to [email or web address] - password [email or web address]  [facility]  Office  [phone]  CC: Primary care physician; System, Pcp Not In  Note: This dictation was prepared with Dragon dictation along with smaller phrase technology. Any transcriptional errors that result  from this process are unintentional.

## 2017-12-06 NOTE — Discharge Instructions (Signed)
Activity with assistance

## 2017-12-06 NOTE — Progress Notes (Signed)
Pt discharged via non-emergent [facility] to [facility]. Report called to accepting RN. IV removed, pt on room air and in no distress.

## 2017-12-06 NOTE — Progress Notes (Signed)
Verified with Dr Elpidio AnisSudini via telephone call; pt does not need Rx for Lantus since he is being discharged to a facility

## 2019-07-06 IMAGING — US US CAROTID DUPLEX BILAT
1 series · 13 of 24 positions shown · non-contrast
Comparison: No prior duplex

CLINICAL DATA: [AGE] male with a history of cerebrovascular
accident.

Cardiovascular risk factors include stroke/TIA, hypertension,
coronary artery disease, hyperlipidemia, diabetes, smoking
Given history of prior left endarterectomy
EXAM:
BILATERAL CAROTID DUPLEX ULTRASOUND
TECHNIQUE: Gray scale imaging, color Doppler and duplex ultrasound were
performed of bilateral carotid and vertebral arteries in the neck.

[Series 1: us carotid duplex bilat · 13 of 62 slices shown]
[im 1/62]
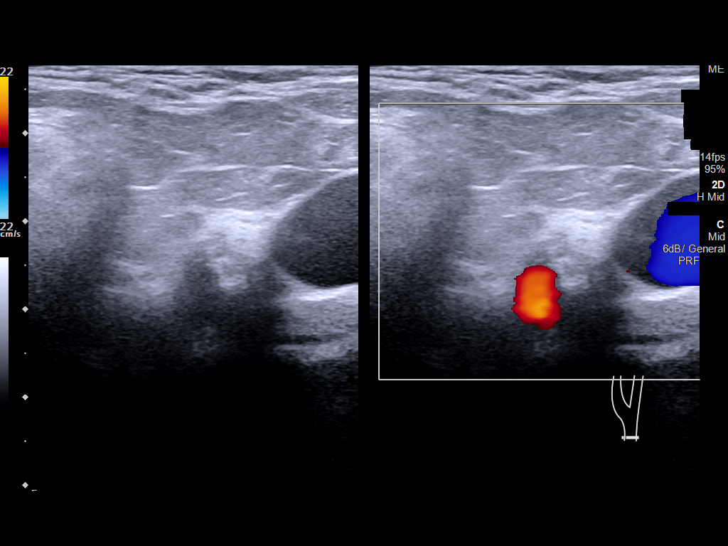
[im 6/62]
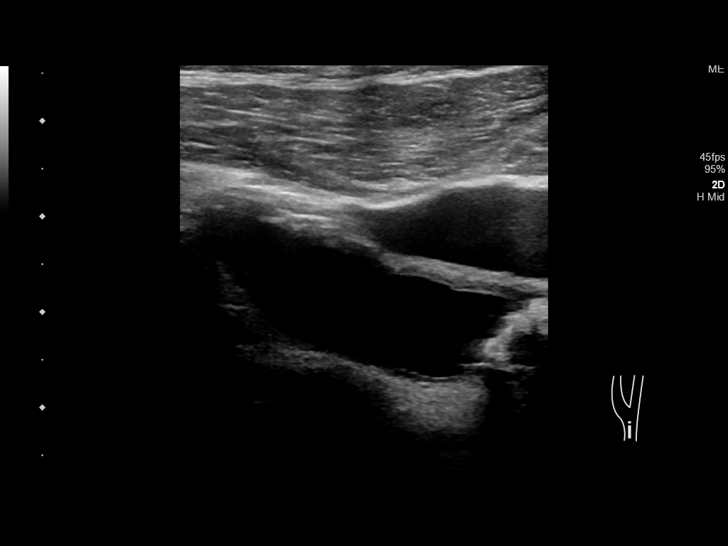
[im 11/62]
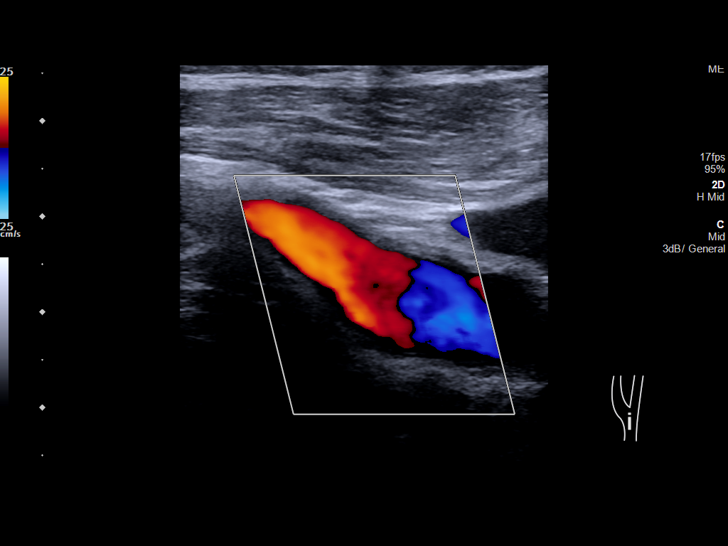
[im 16/62]
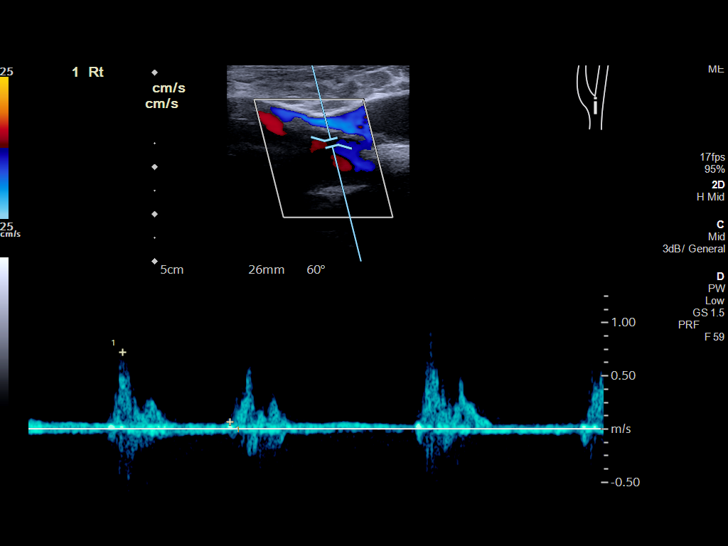
[im 22/62]
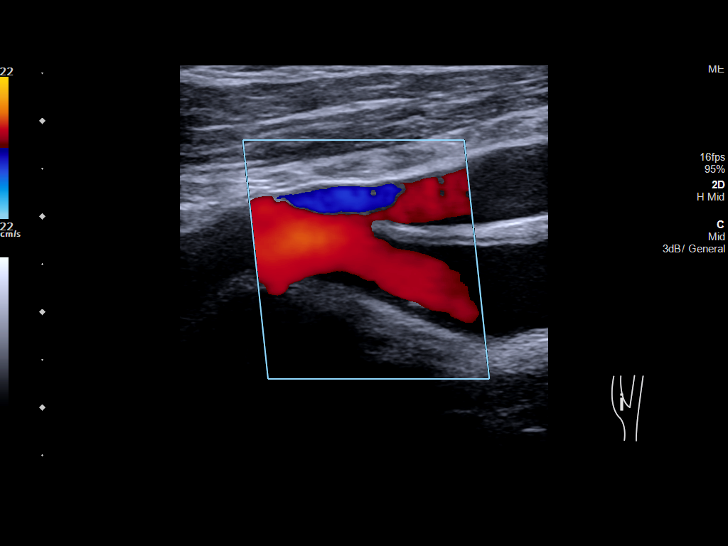
[im 27/62]
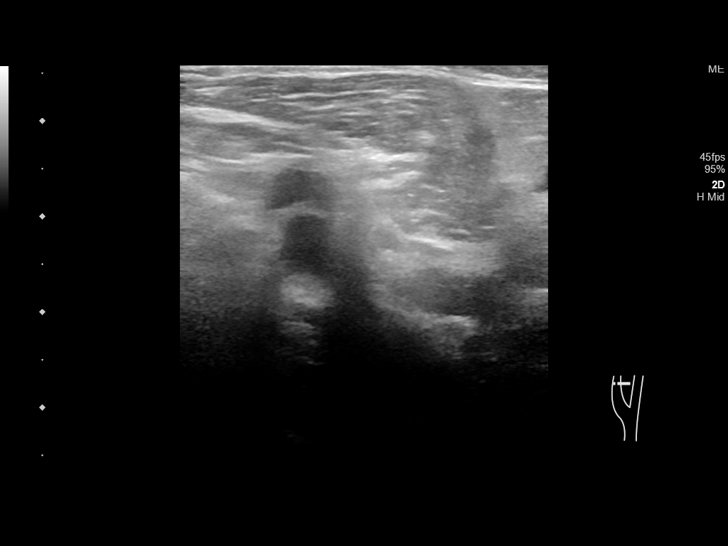
[im 32/62]
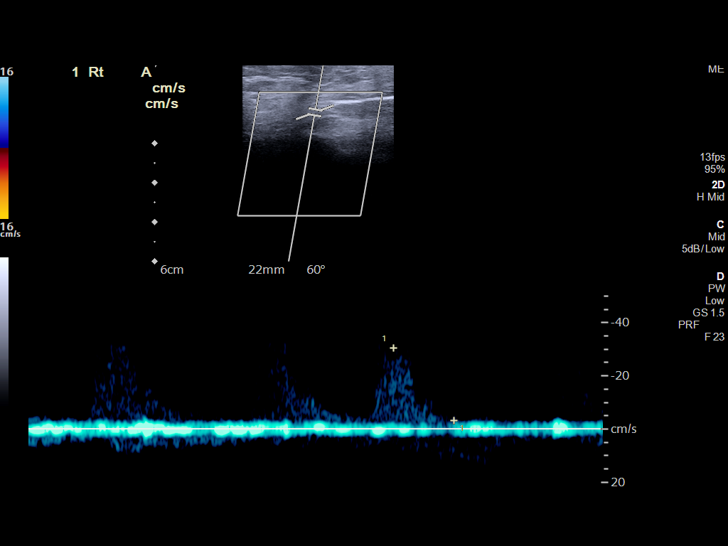
[im 35/62]
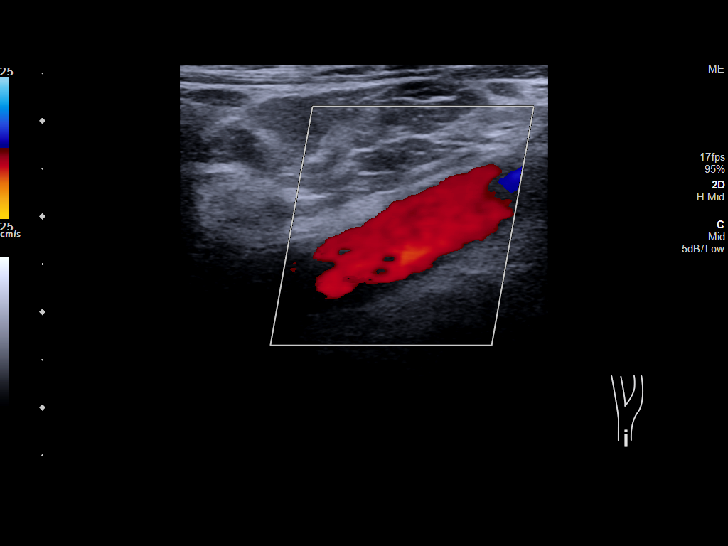
[im 40/62]
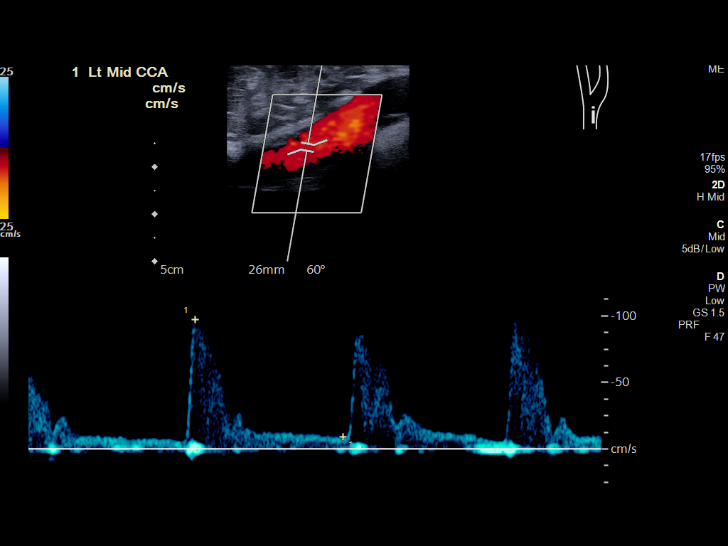
[im 46/62]
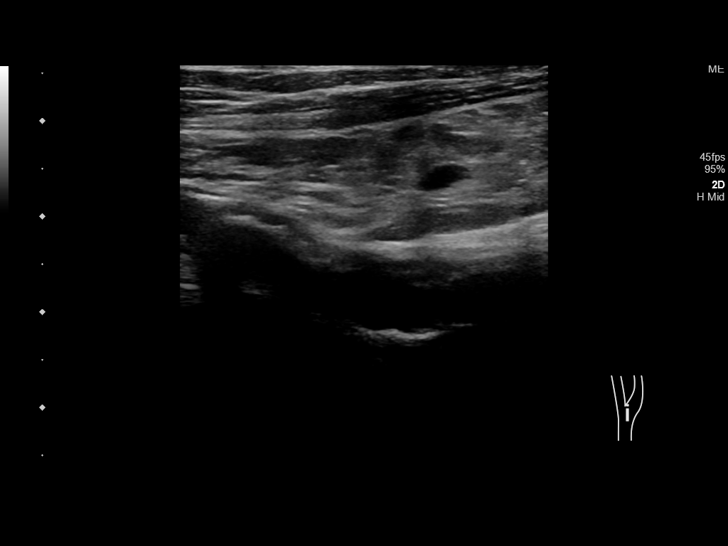
[im 51/62]
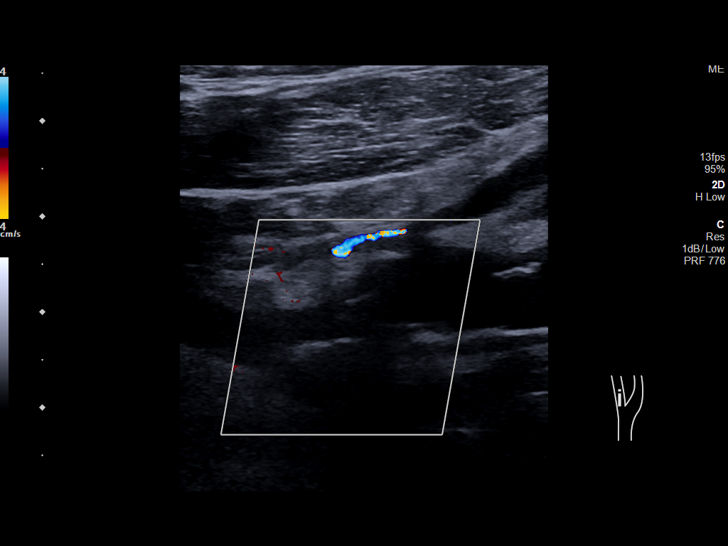
[im 56/62]
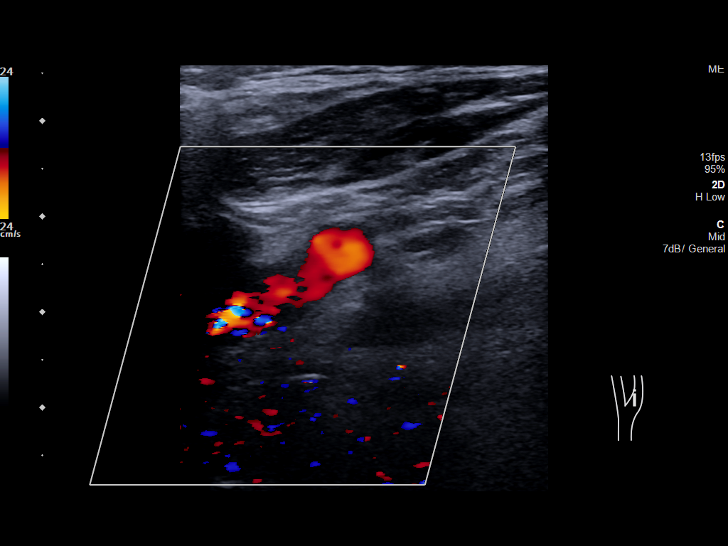
[im 62/62]
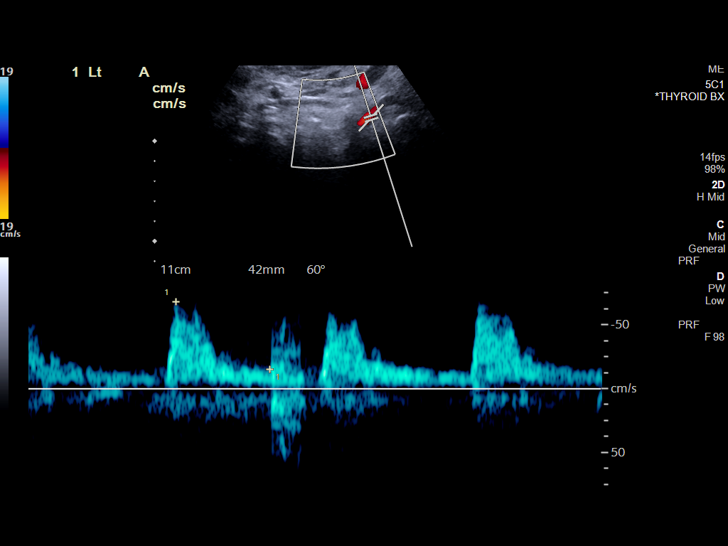

[13 of 24 positions shown; findings below may reference images not displayed]

FINDINGS: Criteria: Quantification of carotid stenosis is based on velocity
parameters that correlate the residual internal carotid diameter
with NASCET-based stenosis levels, using the diameter of the distal
internal carotid lumen as the denominator for stenosis measurement.

The following velocity measurements were obtained:

RIGHT

ICA:  Systolic 113 cm/sec, Diastolic 20 cm/sec

CCA:  92 cm/sec

SYSTOLIC ICA/CCA RATIO:

ECA:  88 cm/sec

LEFT

ICA:  Systolic 102 cm/sec, Diastolic 24 cm/sec

CCA:  97 cm/sec

SYSTOLIC ICA/CCA RATIO:

ECA: Occluded

Right Brachial SBP: Not acquired

Left Brachial SBP: Not acquired

RIGHT CAROTID ARTERY: Significant calcifications of the right common
carotid artery. Intermediate waveform maintained. Moderate
heterogeneous and partially calcified plaque at the right carotid
bifurcation. Lumen shadowing present.. Low resistance waveform of
the right ICA. Mild tortuosity

RIGHT VERTEBRAL ARTERY: Antegrade flow with low resistance waveform.

LEFT CAROTID ARTERY: No significant calcifications of the left
common carotid artery. Intermediate waveform maintained. Moderate
heterogeneous and partially calcified plaque at the left carotid
bifurcation. No significant lumen shadowing. Low resistance waveform
of the left ICA. No significant tortuosity.

LEFT VERTEBRAL ARTERY:  Antegrade flow with low resistance waveform.
IMPRESSION: Right:

Heterogeneous and calcified plaque at the right carotid bifurcation,
as well as within the common carotid artery, with no hemodynamically
significant stenosis by established duplex criteria. Note that the
flow velocities of the right ICA were obtained from an area distal
to the maximum narrowing due to the presence of anterior wall plaque
with shadowing and may be underestimating the percentage of ICA
stenosis. If a more precise assessment is required, consider a
formal cerebral angiogram or alternatively CT angiogram

Left:

There is a history of left carotid endarterectomy, and note that
established duplex criteria have not been validated in the setting
of postsurgical changes. However, the duplex study shows no evidence
of significant restenosis or plaque.

## 2019-07-06 IMAGING — CR DG ABDOMEN 1V
1 series · 2 of 2 positions shown · non-contrast
Comparison: None.

CLINICAL DATA: Evaluate for retained metal for MRI clearance

EXAM:
ABDOMEN - 1 VIEW

[Series 1: dg abd 1 view · 0.14mm/px · 2 of 2 slices shown]
[im 1/2]
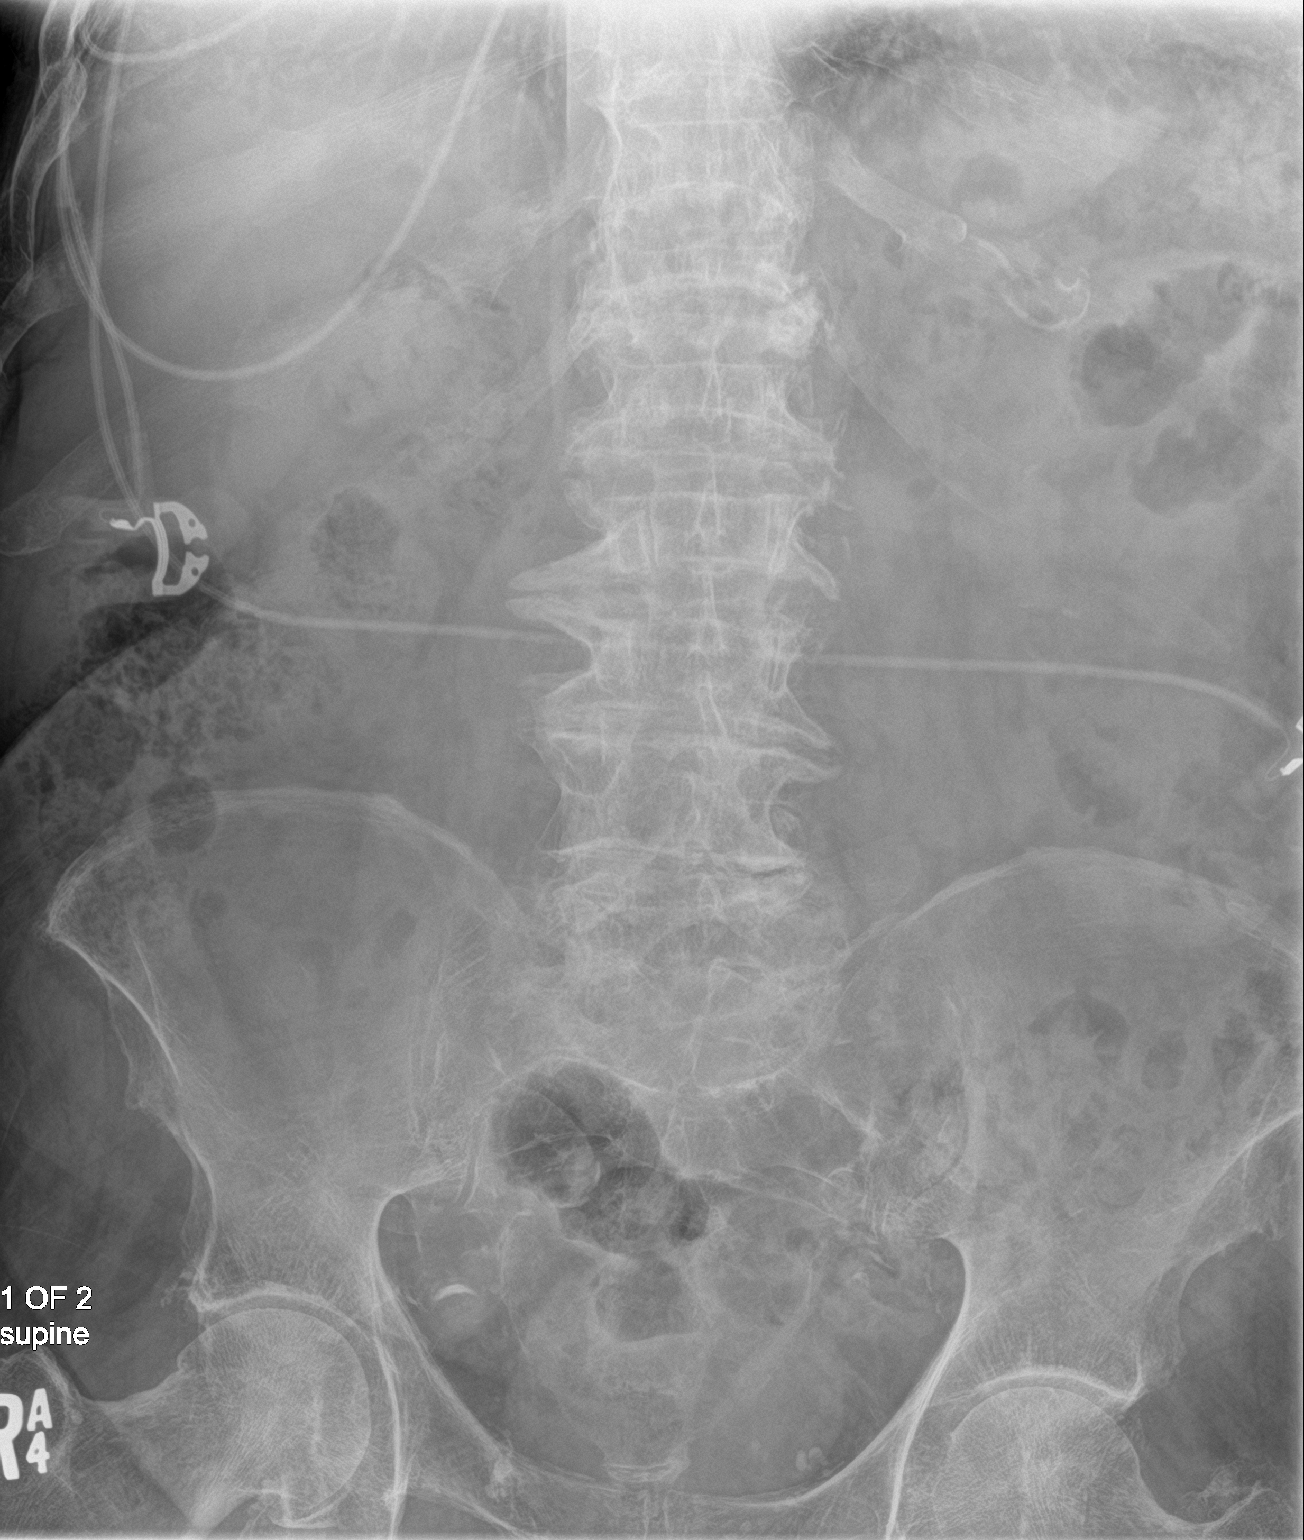
[im 2/2]
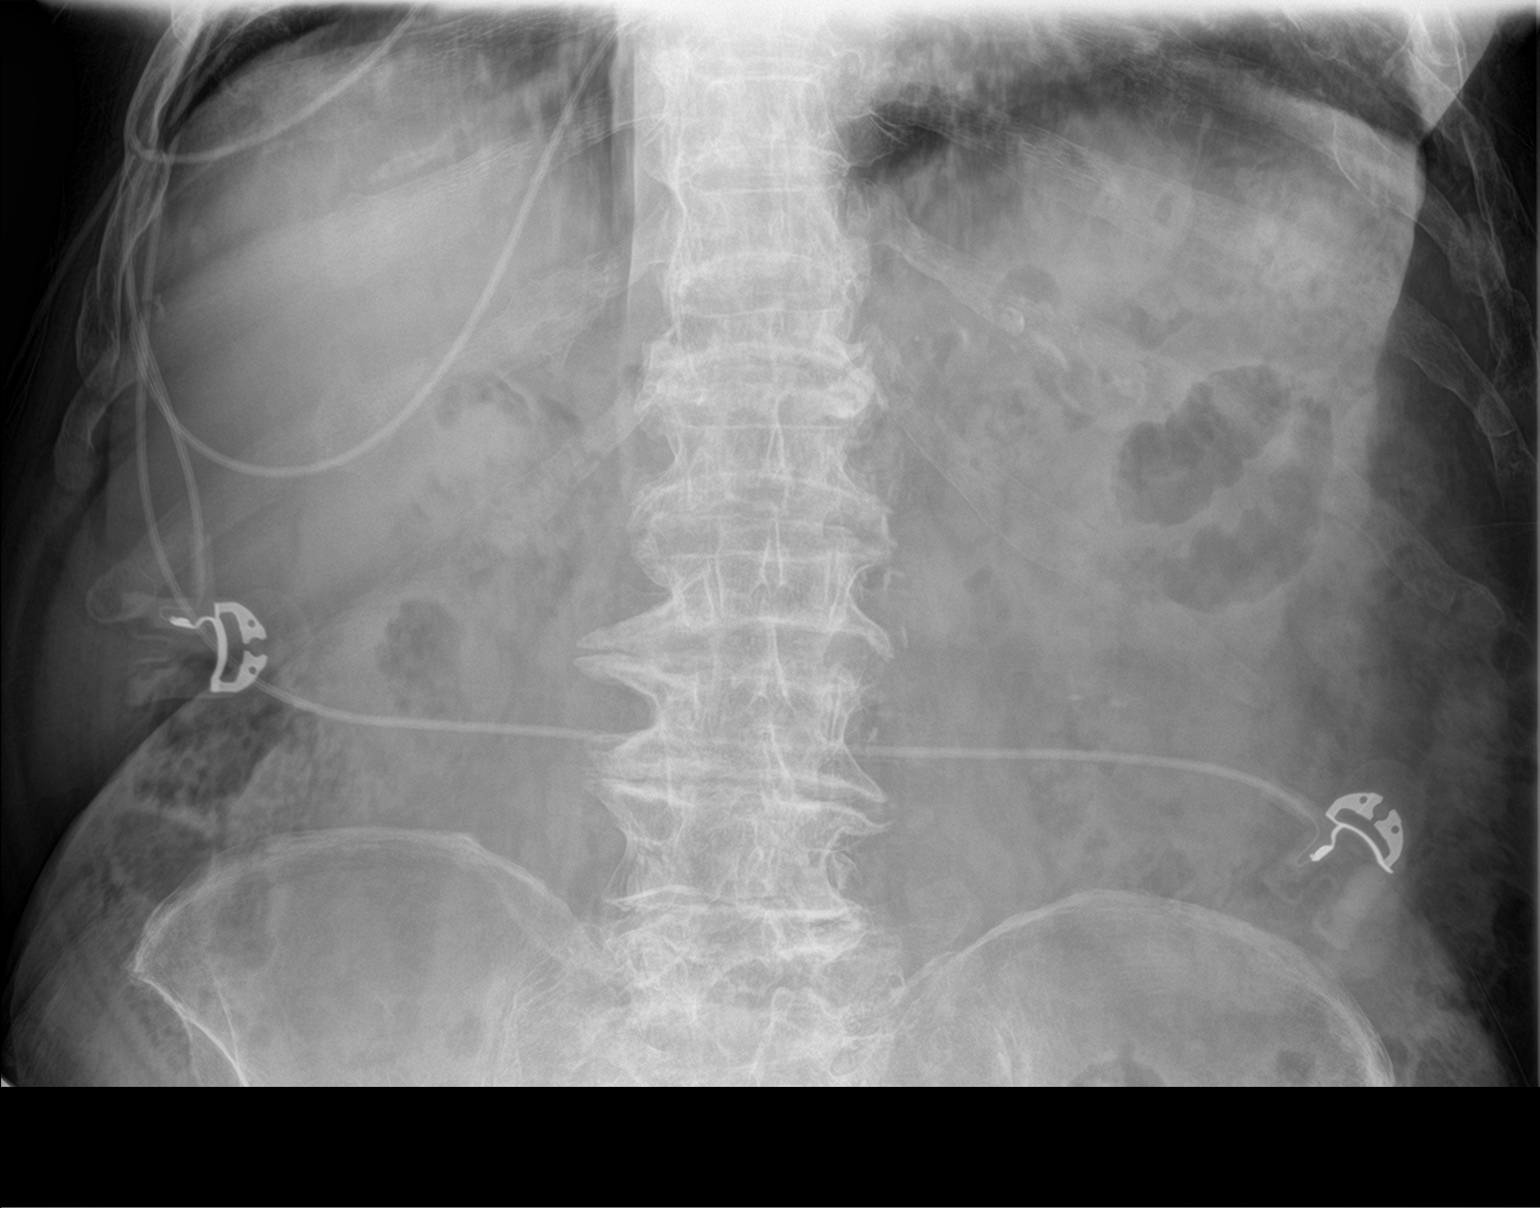

[2 of 2 positions shown; findings below may reference images not displayed]

FINDINGS: Scattered large and small bowel gas is noted. No abnormal mass or
abnormal calcifications are seen. Degenerative changes of the lumbar
spine are noted. No retained metallic densities are identified.
IMPRESSION: No definitive retained metal foreign body

## 2022-01-17 DEATH — deceased
